# Patient Record
Sex: Female | Born: 1975 | Race: White | Hispanic: No | Marital: Single | Smoking: Current some day smoker
Health system: Southern US, Community
[De-identification: ages and names within clinical notes are randomized; demographics above are authoritative.]

## PROBLEM LIST (undated history)

## (undated) DIAGNOSIS — F32A Depression, unspecified: Secondary | ICD-10-CM

## (undated) DIAGNOSIS — F329 Major depressive disorder, single episode, unspecified: Secondary | ICD-10-CM

## (undated) DIAGNOSIS — T7840XA Allergy, unspecified, initial encounter: Secondary | ICD-10-CM

## (undated) DIAGNOSIS — F419 Anxiety disorder, unspecified: Secondary | ICD-10-CM

## (undated) HISTORY — DX: Anxiety disorder, unspecified: F41.9

## (undated) HISTORY — DX: Depression, unspecified: F32.A

## (undated) HISTORY — DX: Major depressive disorder, single episode, unspecified: F32.9

## (undated) HISTORY — DX: Allergy, unspecified, initial encounter: T78.40XA

## (undated) HISTORY — PX: ABDOMINOPLASTY: SUR9

## (undated) HISTORY — PX: NASAL SEPTUM SURGERY: SHX37

---

## 2003-04-01 ENCOUNTER — Emergency Department (HOSPITAL_COMMUNITY): Admission: EM | Admit: 2003-04-01 | Discharge: 2003-04-01 | Payer: Self-pay | Admitting: Emergency Medicine

## 2003-08-31 ENCOUNTER — Other Ambulatory Visit: Admission: RE | Admit: 2003-08-31 | Discharge: 2003-08-31 | Payer: Self-pay | Admitting: *Deleted

## 2004-02-21 ENCOUNTER — Inpatient Hospital Stay (HOSPITAL_COMMUNITY): Admission: AD | Admit: 2004-02-21 | Discharge: 2004-02-21 | Payer: Self-pay | Admitting: Pediatrics

## 2004-02-22 ENCOUNTER — Inpatient Hospital Stay (HOSPITAL_COMMUNITY): Admission: AD | Admit: 2004-02-22 | Discharge: 2004-02-26 | Payer: Self-pay | Admitting: Obstetrics and Gynecology

## 2004-02-23 ENCOUNTER — Encounter (INDEPENDENT_AMBULATORY_CARE_PROVIDER_SITE_OTHER): Payer: Self-pay | Admitting: Specialist

## 2004-04-10 ENCOUNTER — Other Ambulatory Visit: Admission: RE | Admit: 2004-04-10 | Discharge: 2004-04-10 | Payer: Self-pay | Admitting: Obstetrics and Gynecology

## 2004-09-02 ENCOUNTER — Inpatient Hospital Stay (HOSPITAL_COMMUNITY): Admission: AD | Admit: 2004-09-02 | Discharge: 2004-09-03 | Payer: Self-pay | Admitting: Obstetrics and Gynecology

## 2005-08-07 ENCOUNTER — Ambulatory Visit: Payer: Self-pay | Admitting: Family Medicine

## 2007-08-03 ENCOUNTER — Ambulatory Visit (HOSPITAL_COMMUNITY): Admission: RE | Admit: 2007-08-03 | Discharge: 2007-08-03 | Payer: Self-pay | Admitting: Obstetrics and Gynecology

## 2007-08-06 ENCOUNTER — Ambulatory Visit (HOSPITAL_COMMUNITY): Admission: RE | Admit: 2007-08-06 | Discharge: 2007-08-06 | Payer: Self-pay | Admitting: Internal Medicine

## 2007-08-10 ENCOUNTER — Ambulatory Visit (HOSPITAL_COMMUNITY): Admission: RE | Admit: 2007-08-10 | Discharge: 2007-08-10 | Payer: Self-pay | Admitting: Obstetrics and Gynecology

## 2007-08-13 ENCOUNTER — Ambulatory Visit (HOSPITAL_COMMUNITY): Admission: RE | Admit: 2007-08-13 | Discharge: 2007-08-13 | Payer: Self-pay | Admitting: Obstetrics and Gynecology

## 2007-08-14 ENCOUNTER — Ambulatory Visit (HOSPITAL_COMMUNITY): Admission: RE | Admit: 2007-08-14 | Discharge: 2007-08-14 | Payer: Self-pay | Admitting: Obstetrics and Gynecology

## 2007-08-17 ENCOUNTER — Ambulatory Visit (HOSPITAL_COMMUNITY): Admission: RE | Admit: 2007-08-17 | Discharge: 2007-08-17 | Payer: Self-pay | Admitting: Obstetrics and Gynecology

## 2007-08-20 ENCOUNTER — Ambulatory Visit (HOSPITAL_COMMUNITY): Admission: RE | Admit: 2007-08-20 | Discharge: 2007-08-20 | Payer: Self-pay | Admitting: Obstetrics and Gynecology

## 2007-08-25 ENCOUNTER — Ambulatory Visit (HOSPITAL_COMMUNITY): Admission: RE | Admit: 2007-08-25 | Discharge: 2007-08-25 | Payer: Self-pay | Admitting: Obstetrics and Gynecology

## 2007-08-27 ENCOUNTER — Ambulatory Visit (HOSPITAL_COMMUNITY): Admission: RE | Admit: 2007-08-27 | Discharge: 2007-08-27 | Payer: Self-pay | Admitting: Obstetrics and Gynecology

## 2007-09-02 ENCOUNTER — Ambulatory Visit (HOSPITAL_COMMUNITY): Admission: RE | Admit: 2007-09-02 | Discharge: 2007-09-02 | Payer: Self-pay | Admitting: Obstetrics and Gynecology

## 2007-09-10 ENCOUNTER — Ambulatory Visit (HOSPITAL_COMMUNITY): Admission: RE | Admit: 2007-09-10 | Discharge: 2007-09-10 | Payer: Self-pay | Admitting: Obstetrics and Gynecology

## 2007-09-17 ENCOUNTER — Ambulatory Visit (HOSPITAL_COMMUNITY): Admission: RE | Admit: 2007-09-17 | Discharge: 2007-09-17 | Payer: Self-pay | Admitting: Obstetrics and Gynecology

## 2007-09-23 ENCOUNTER — Ambulatory Visit (HOSPITAL_COMMUNITY): Admission: RE | Admit: 2007-09-23 | Discharge: 2007-09-23 | Payer: Self-pay | Admitting: Obstetrics and Gynecology

## 2007-09-29 ENCOUNTER — Ambulatory Visit: Payer: Self-pay | Admitting: Obstetrics & Gynecology

## 2007-10-02 ENCOUNTER — Ambulatory Visit (HOSPITAL_COMMUNITY): Admission: RE | Admit: 2007-10-02 | Discharge: 2007-10-02 | Payer: Self-pay | Admitting: Obstetrics and Gynecology

## 2007-10-07 ENCOUNTER — Ambulatory Visit (HOSPITAL_COMMUNITY): Admission: RE | Admit: 2007-10-07 | Discharge: 2007-10-07 | Payer: Self-pay | Admitting: Obstetrics and Gynecology

## 2007-10-09 ENCOUNTER — Ambulatory Visit: Payer: Self-pay | Admitting: Obstetrics & Gynecology

## 2007-10-13 ENCOUNTER — Ambulatory Visit: Payer: Self-pay | Admitting: Obstetrics & Gynecology

## 2007-10-16 ENCOUNTER — Ambulatory Visit: Payer: Self-pay | Admitting: Obstetrics & Gynecology

## 2007-10-20 ENCOUNTER — Ambulatory Visit (HOSPITAL_COMMUNITY): Admission: RE | Admit: 2007-10-20 | Discharge: 2007-10-20 | Payer: Self-pay | Admitting: Obstetrics and Gynecology

## 2007-10-23 ENCOUNTER — Ambulatory Visit: Payer: Self-pay | Admitting: Obstetrics & Gynecology

## 2007-10-27 ENCOUNTER — Ambulatory Visit: Payer: Self-pay | Admitting: Obstetrics & Gynecology

## 2007-10-30 ENCOUNTER — Ambulatory Visit: Payer: Self-pay | Admitting: Obstetrics & Gynecology

## 2007-11-03 ENCOUNTER — Ambulatory Visit: Payer: Self-pay | Admitting: Obstetrics & Gynecology

## 2007-11-04 ENCOUNTER — Encounter (INDEPENDENT_AMBULATORY_CARE_PROVIDER_SITE_OTHER): Payer: Self-pay | Admitting: Obstetrics and Gynecology

## 2007-11-04 ENCOUNTER — Inpatient Hospital Stay (HOSPITAL_COMMUNITY): Admission: RE | Admit: 2007-11-04 | Discharge: 2007-11-07 | Payer: Self-pay | Admitting: Obstetrics and Gynecology

## 2008-05-26 IMAGING — US US OB FOLLOW-UP
1 series · 14 of 28 positions shown · non-contrast
Comparison: none

OBSTETRICAL ULTRASOUND:
 This ultrasound was performed in The [HOSPITAL], and the [facility] report will be stored to [REDACTED] PACS.

[Series 1: us ob follow-up · 14 of 43 slices shown]
[im 2/43]
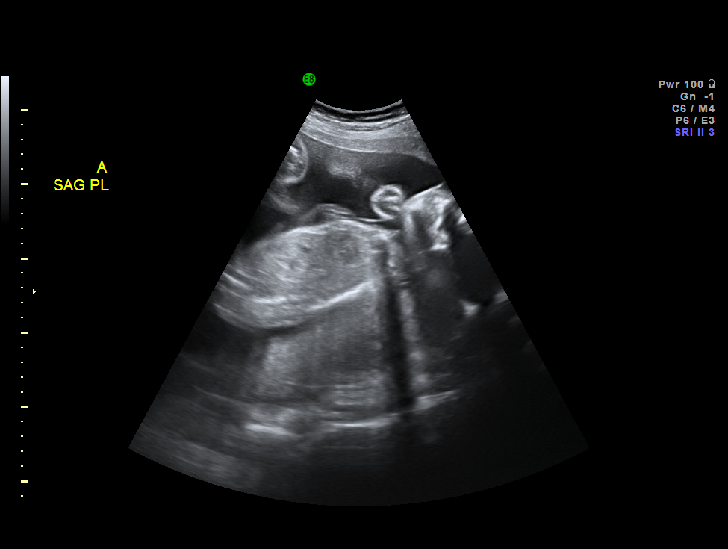
[im 5/43]
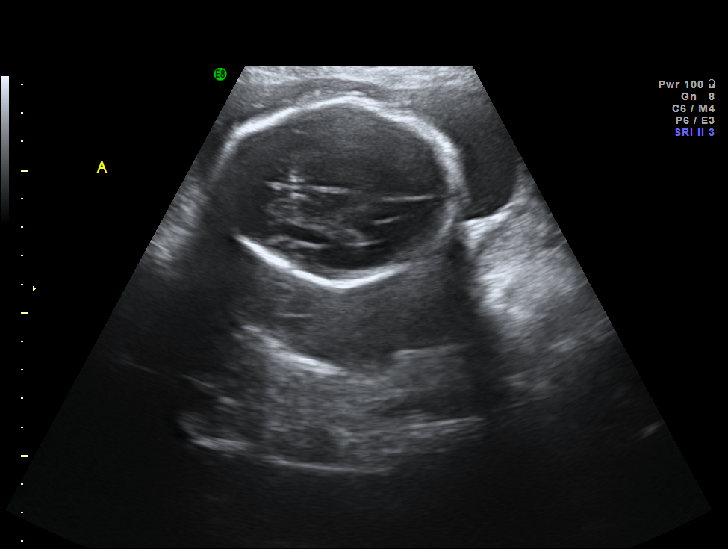
[im 8/43]
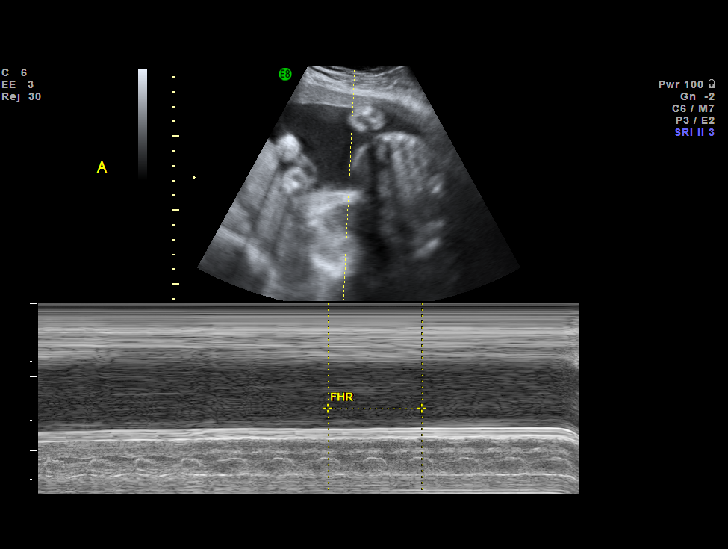
[im 11/43]
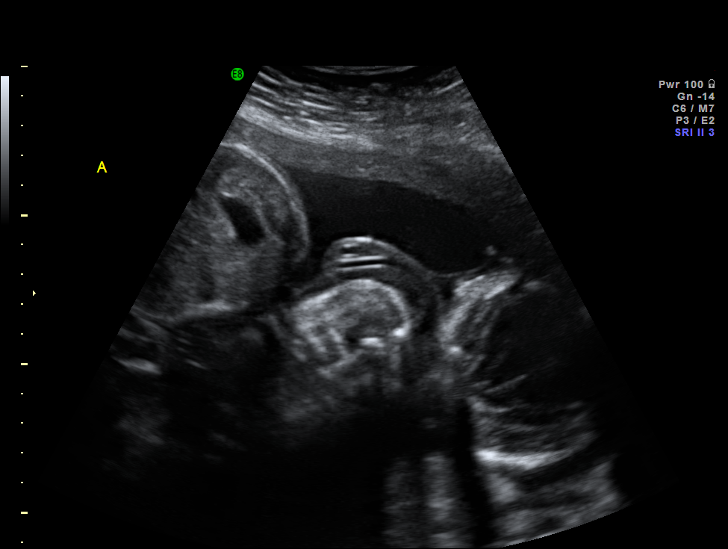
[im 15/43]
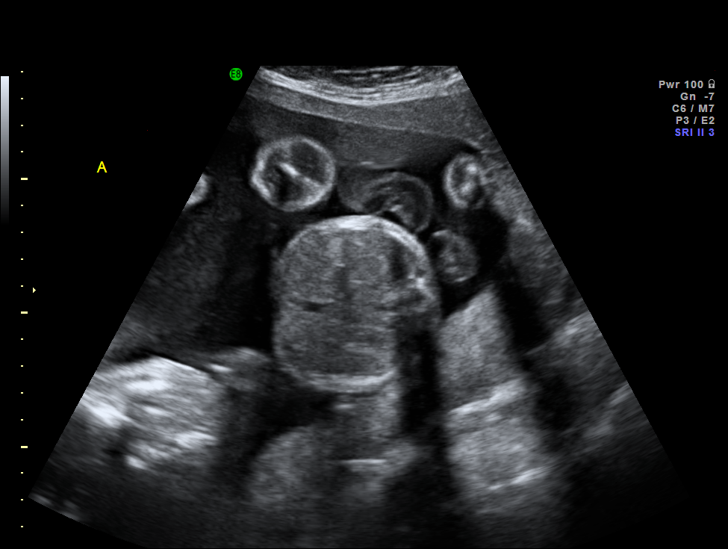
[im 18/43]
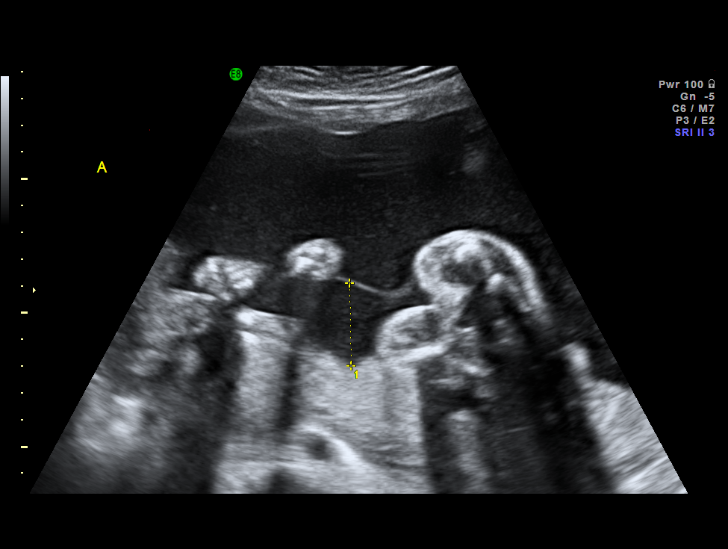
[im 21/43]
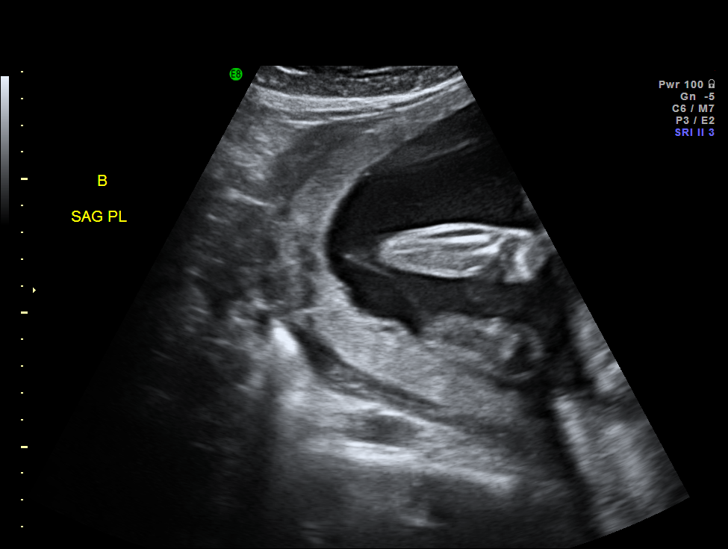
[im 24/43]
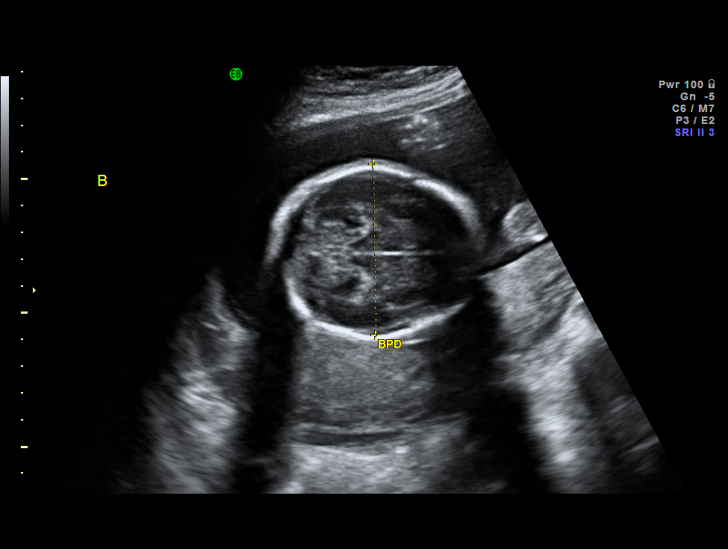
[im 27/43]
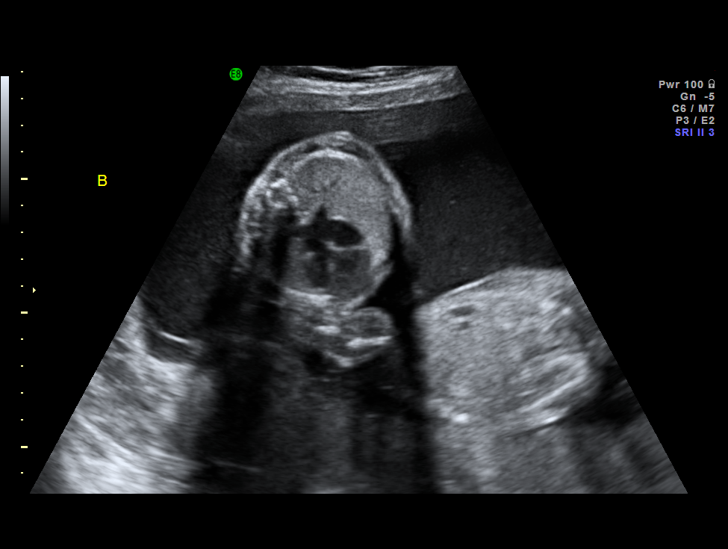
[im 30/43]
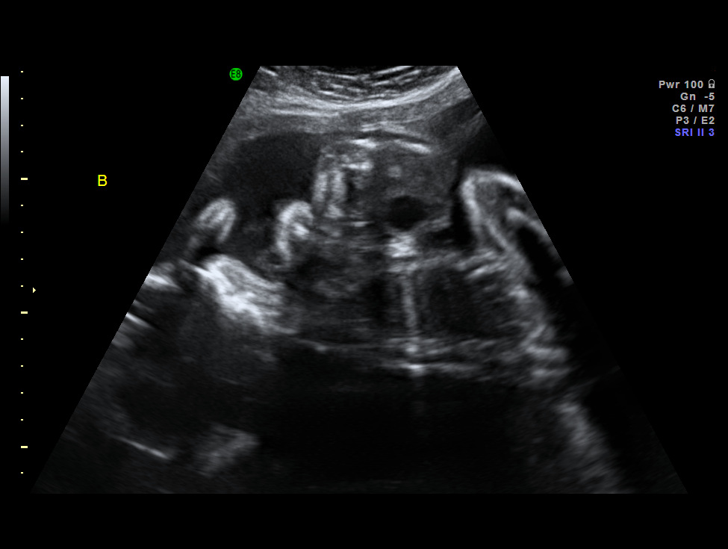
[im 33/43]
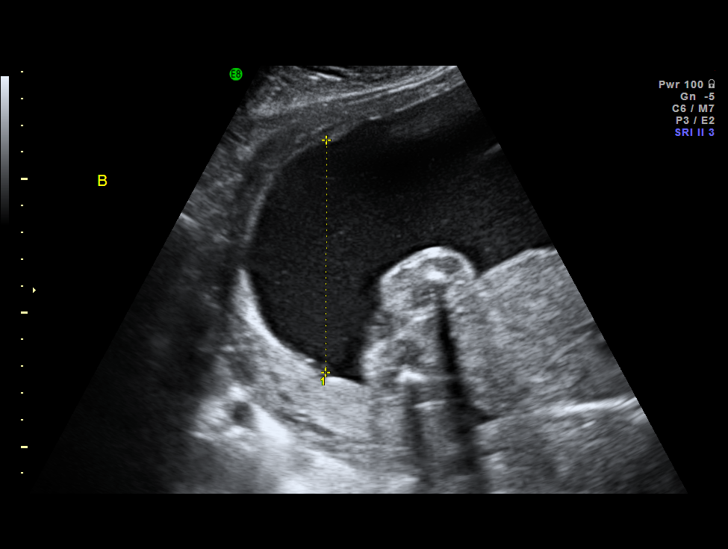
[im 36/43]
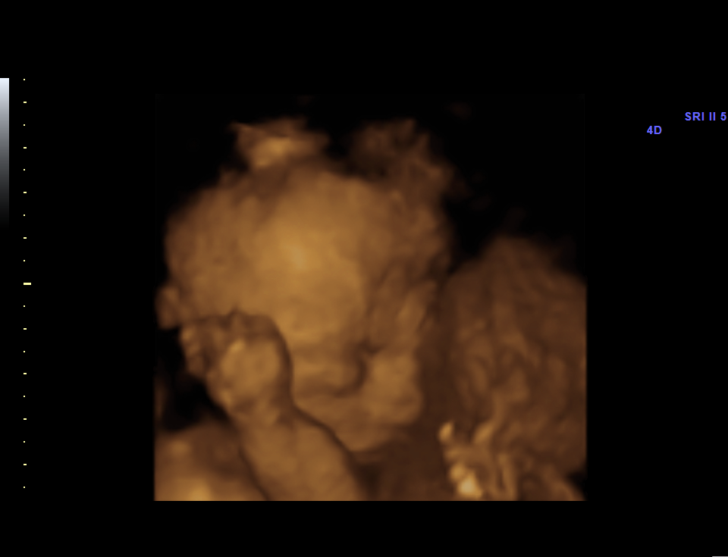
[im 39/43]
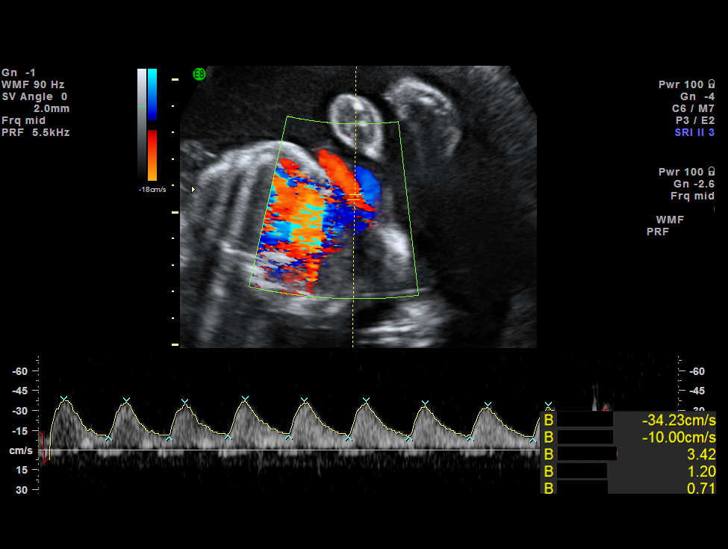
[im 43/43]
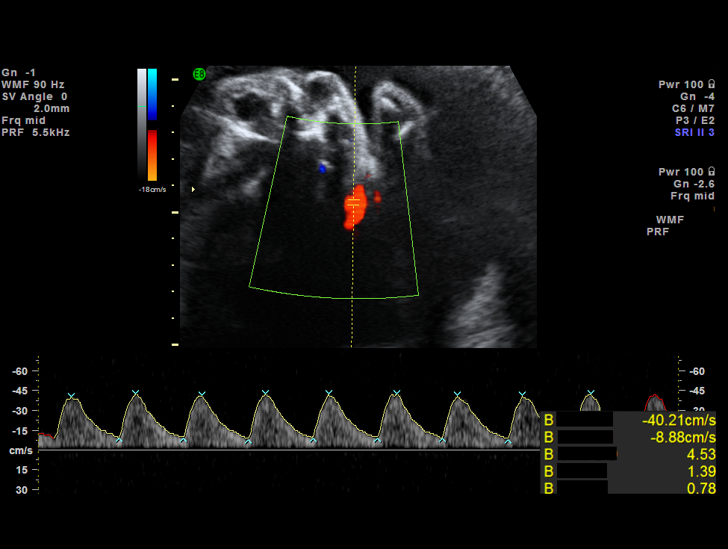

[14 of 28 positions shown; findings below may reference images not displayed]

IMPRESSION: The [facility] report has also been faxed to the ordering physician.

## 2008-06-11 IMAGING — US US OB LIMITED
1 series · 14 of 28 positions shown · non-contrast
Comparison: none

OBSTETRICAL ULTRASOUND:
 This ultrasound was performed in The [HOSPITAL], and the [facility] report will be stored to [REDACTED] PACS.

[Series 1: us ob limited · 14 of 50 slices shown]
[im 2/50]
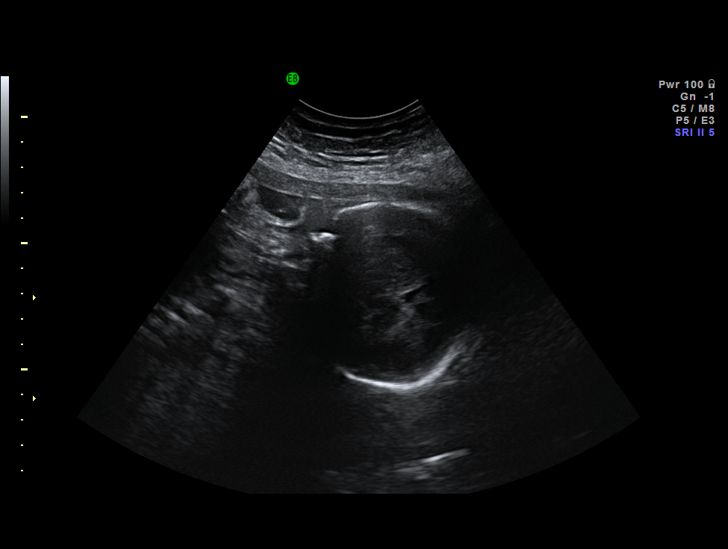
[im 6/50]
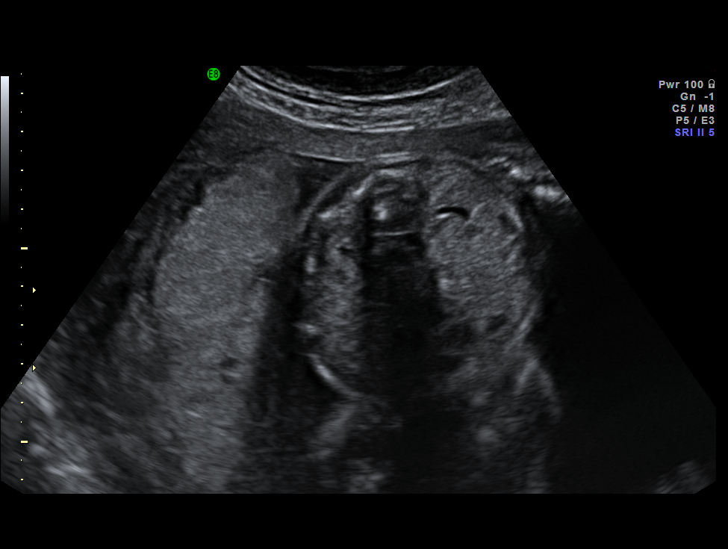
[im 10/50]
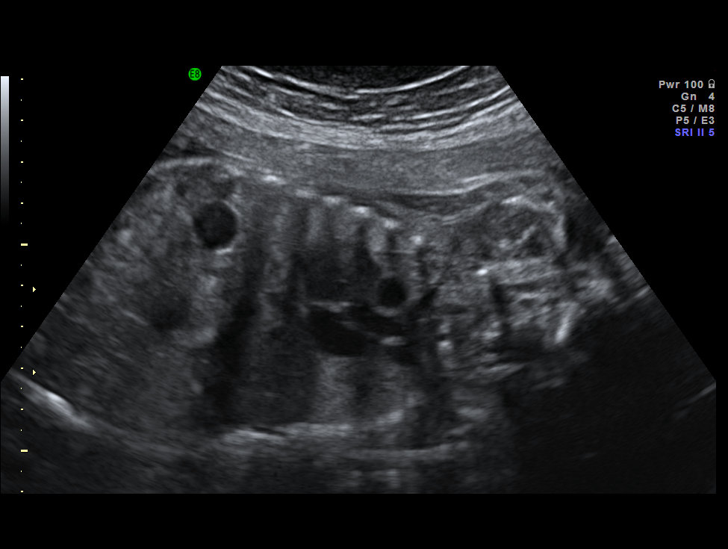
[im 13/50]
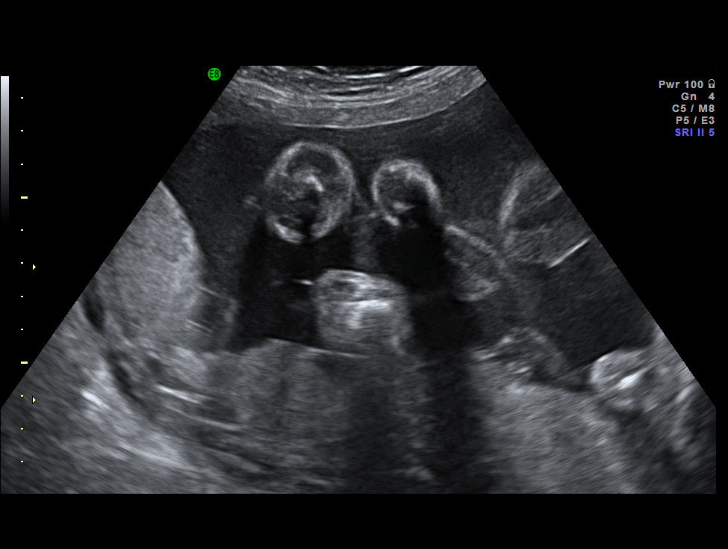
[im 17/50]
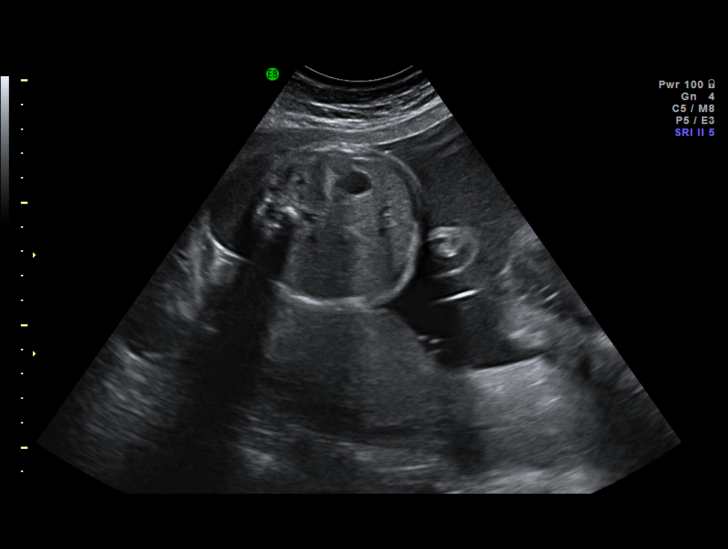
[im 20/50]
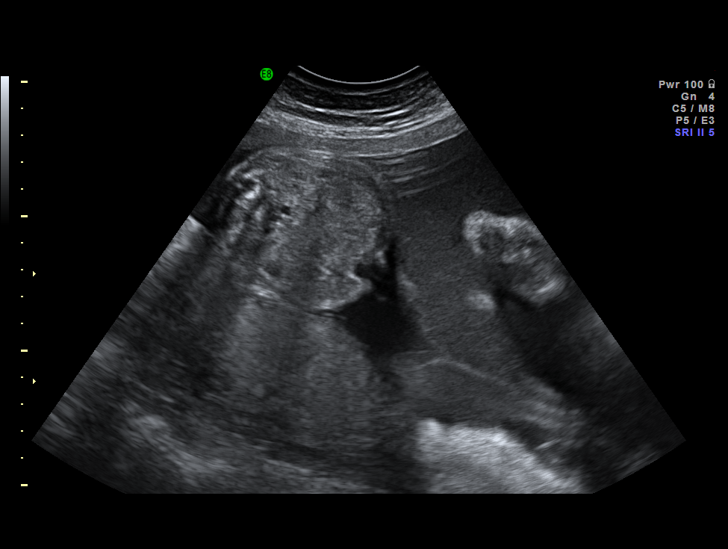
[im 24/50]
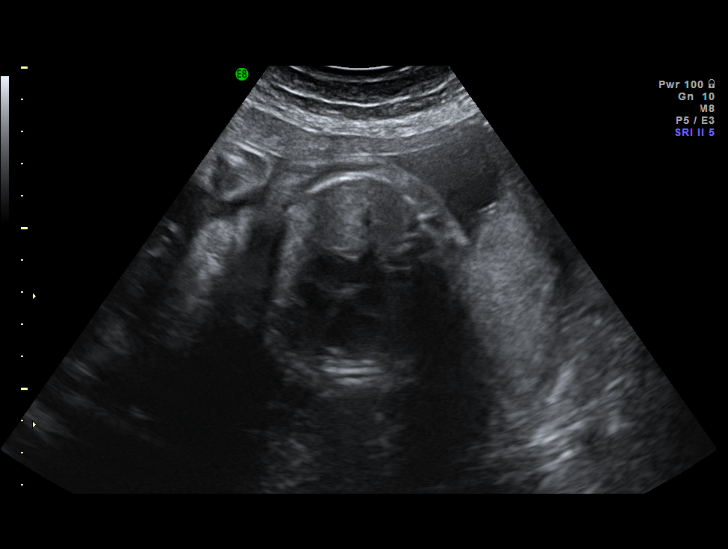
[im 28/50]
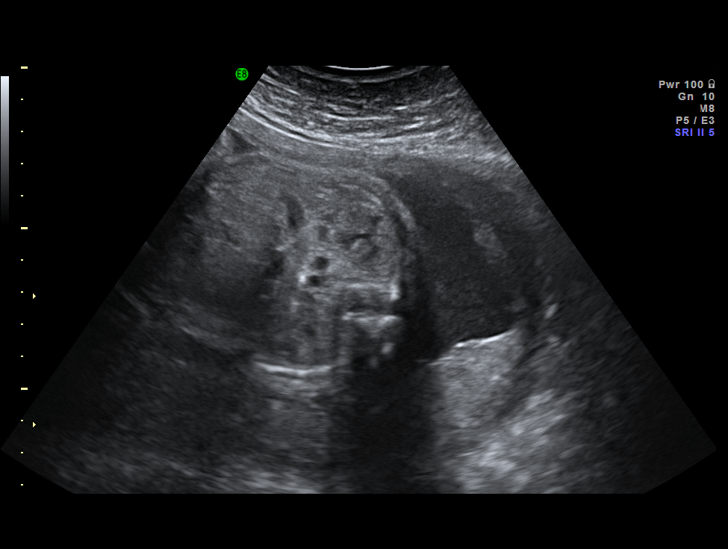
[im 31/50]
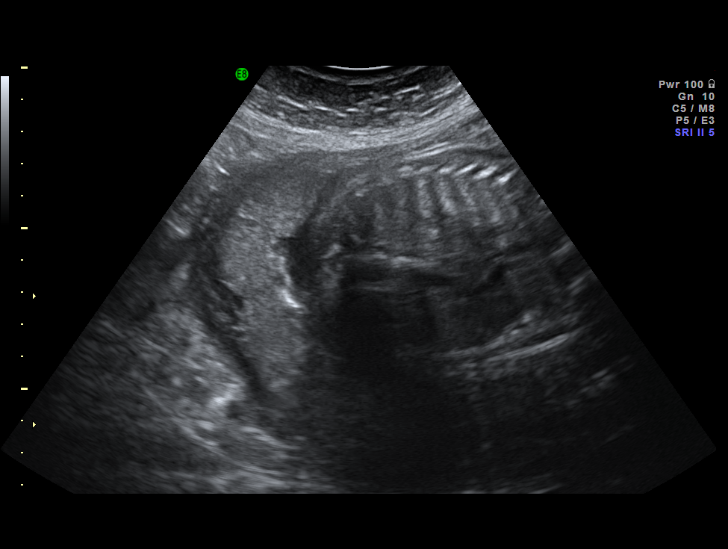
[im 35/50]
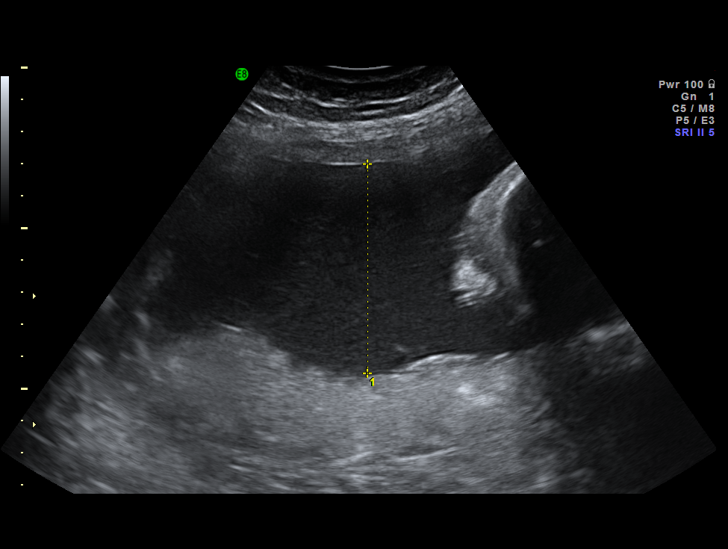
[im 39/50]
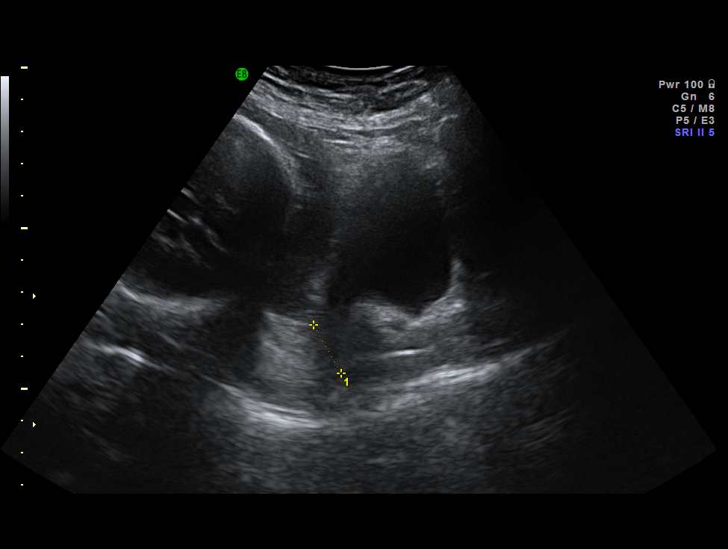
[im 42/50]
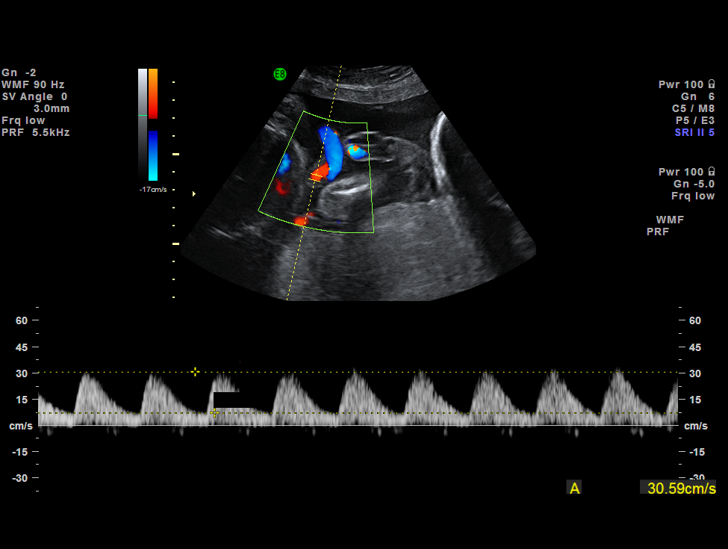
[im 46/50]
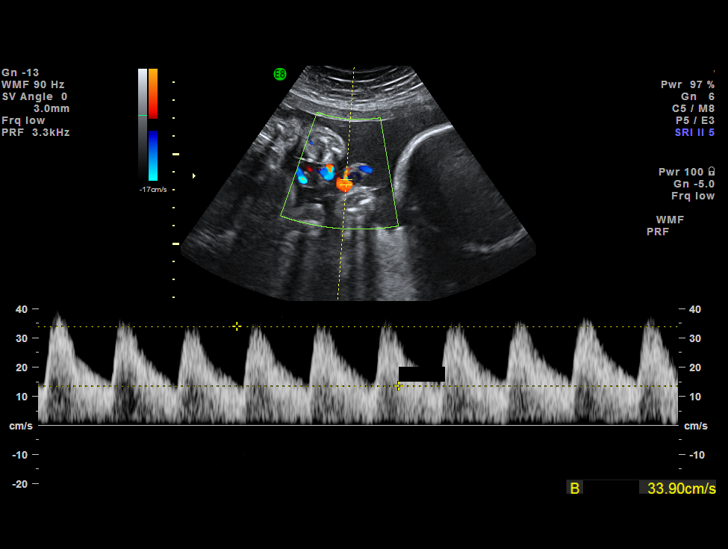
[im 50/50]
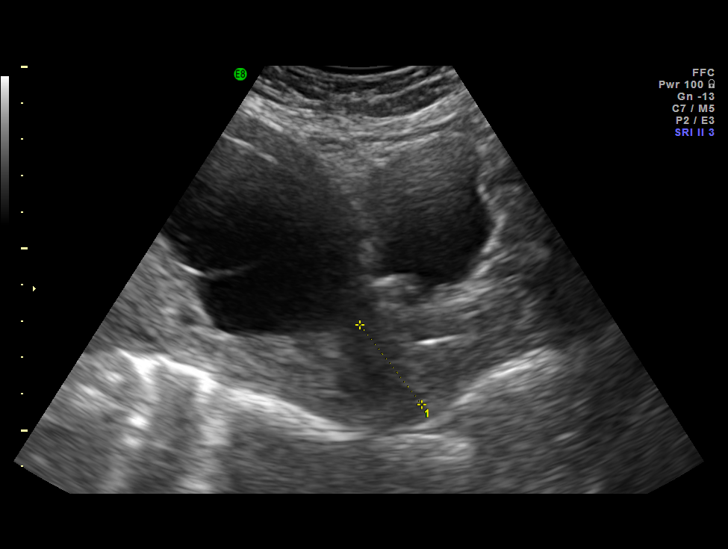

[14 of 28 positions shown; findings below may reference images not displayed]

IMPRESSION: The [facility] report has also been faxed to the ordering physician.

## 2010-12-09 ENCOUNTER — Encounter (HOSPITAL_COMMUNITY): Payer: Self-pay | Admitting: Obstetrics and Gynecology

## 2011-04-02 NOTE — Op Note (Signed)
NAMEMIHIRA, TOZZI NO.:  [id number]   MEDICAL RECORD NO.:  [id number]          PATIENT TYPE:  WOC   LOCATION:  WOC                          FACILITY:  [facility]   PHYSICIAN:  Duke Salvia. Marcelle Overlie, M.D.DATE OF BIRTH:  1976-11-12   DATE OF PROCEDURE:  11/04/2007  DATE OF DISCHARGE:                               OPERATIVE REPORT   PREOPERATIVE DIAGNOSES:  1. A 37-1/2 to 38 week intrauterine pregnancy.  2. Twin gestation.  3. 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  4. Previous cesarean.   POSTOPERATIVE DIAGNOSES:  1. A 37-1/2 to 38 week intrauterine pregnancy.  2. Twin gestation.  3. 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  4. Previous cesarean.   PROCEDURE:  Repeat low transverse cesarean section.   SURGEON:  Dr. Marcelle Overlie.   ANESTHESIA:  Spinal.   COMPLICATIONS:  None.   DRAINS:  Foley catheter.   BLOOD LOSS:  800 mL.   SPECIMENS TO PATHOLOGY:  Placenta.   PROCEDURE AND FINDINGS:  The patient was taken to the operating room  after an adequate level of spinal anesthetic was obtained with the  patient in left tilt position.  The abdomen prepped and draped in the  usual manner for several abdominal procedures. The Foley catheter  positioned draining clear urine.  A transverse incision made through the  old scar which was well-healed, carried down to the fascia which was  incised and extended transversely. The  Rectus muscle divided midline,  peritoneum entered superiorly without incident and extended in a  vertical manner. The vesicouterine serosa was then incised and the  bladder was bluntly and sharply dissected below, bladder blade was  repositioned. A transverse incision made in the lower segment,  extended  with bandage scissors, clear fluid noted.  Baby A delivered from the  vertex presentation, a female, Apgars 9 and 10.  The infant was  suctioned, cord clamped and passed to the pediatric team.  The membranes  were then ruptured, clear fluid, baby  B in vertex presentation another  female, Apgars 9 and 9. A pH was sent on both. Both babies in excellent  condition at that point.  The placenta was delivered manually intact,  sent to pathology.  Uterus exteriorized, cavity wiped clean with  laparotomy pack.  Closure obtained with the first layer of #0 chromic in  locked fashion followed by an imbricating layer of #0 chromic.  This was  hemostatic. The bladder flap area was noted to be intact and hemostatic.  A small hydatid from the right distal tube was clamped, divided and free  tied with a zero tie and sent to pathology.  The tubes and ovaries  otherwise normal. Prior to closure, sponge, needle and instrument counts  were reported as correct x2.  The perineum closed with a running 3-0  Vicryl suture.  The fascia closed from laterally to midline on either  side with a #0 PDS suture.  The subcutaneous tissue was hemostatic.  Clips and Steri-Strips used on the skin.  She tolerated this well and  went to recovery room in good condition.  She did  receive preop  antibiotics and clear urine noted at the end of the case.      Richard M. Marcelle Overlie, M.D.  Electronically Signed     RMH/MEDQ  D:  11/04/2007  T:  11/04/2007  Job:  [id number]

## 2011-04-02 NOTE — H&P (Signed)
NAMEZALIYAH, Nichole Allen NO.:  [id number]   MEDICAL RECORD NO.:  [id number]          PATIENT TYPE:  INP   LOCATION:                                [facility]   PHYSICIAN:  Duke Salvia. Marcelle Overlie, M.D.DATE OF BIRTH:  1976-05-25   DATE OF ADMISSION:  11/03/2007  DATE OF DISCHARGE:                              HISTORY & PHYSICAL   CHIEF COMPLAINT:  For repeat cesarean section at term.   HISTORY OF PRESENT ILLNESS:  A 35 year old G79, P1-0-2-1, EDD January 6,  at 38 weeks, presents for a repeat cesarean section.  This pregnancy has  been complicated by TTTS.  She has been followed weekly by maternal-  fetal medicine with reactive NST A and B and careful serial ultrasound  follow-up.  This procedure including the risks of bleeding, infection,  transfusion, adjacent organ injury, other complications related to  phlebitis, infection, all discussed with her, which she understands and  accepts.   Please see her [facility] form for details of her past medical history.   ALLERGIES:  AMOXICILLIN and SULFA.   She has had two prior pregnancy terminations and a cesarean section in  April 2005 for CPD.   EXAM:  Temperature 98.2, blood pressure 120/80.  HEENT:  Unremarkable.  NECK:  Supple without masses.  LUNGS:  Clear.  CARDIOVASCULAR:  Regular rate and rhythm without murmurs, rubs or  gallops noted.  BREASTS:  Without masses.  Fundal height 42 cm.  Heart rate A and B in the 140s.  Cervix closed.  EXTREMITIES:  Unremarkable.  NEUROLOGIC:  Unremarkable.   IMPRESSION:  1. Thirty-eight week twin gestation.  2. Mild TTTS, followed by maternal-fetal medicine as noted above.  3. Previous cesarean section, desires repeat.   PLAN:  Repeat cesarean section.  The procedure and risks were reviewed  as above.      Richard M. Marcelle Overlie, M.D.  Electronically Signed    RMH/MEDQ  D:  11/03/2007  T:  11/03/2007  Job:  [id number]

## 2011-04-05 NOTE — Op Note (Signed)
NAME:  Nichole Allen, Nichole Allen                         ACCOUNT NO.:  [id number]   MEDICAL RECORD NO.:  [id number]                   PATIENT TYPE:  INP   LOCATION:  9132                                 [facility]   PHYSICIAN:  Duke Salvia. Marcelle Overlie, M.D.            DATE OF BIRTH:  05/18/1976   DATE OF PROCEDURE:  02/23/2004  DATE OF DISCHARGE:                                 OPERATIVE REPORT   PREOPERATIVE DIAGNOSIS:  Cephalopelvic disproportion.   POSTOPERATIVE DIAGNOSIS:  Cephalopelvic disproportion, occiput posterior  presentation.   PROCEDURE:  Primary low transverse cesarean section.   SURGEON:  Duke Salvia. Marcelle Overlie, M.D.   ANESTHESIA:  Epidural.   COMPLICATIONS:  None.   DRAINS:  Foley catheter.   BLOOD LOSS:  800.   PROCEDURE AND FINDINGS:  The patient was taken to the operating room.  After  an adequate level of epidural anesthesia was obtained with the patient in  the left tilt position, the abdomen prepped and draped in the usual manner  for sterile abdominal procedures, Foley catheter positioned draining clear  urine.  Transverse Pfannenstiel incision was made three finger-breadths  above the symphysis, carried down to the fascia which was incised and  extended transversely.  Rectus muscles divided in the midline.  Peritoneum  entered superiorly without incident and extended vertically.  The bladder  blade retractor was positioned.  The vesicouterine serosa was then incised  and with both sharp and blunt dissection this was dissected below and the  bladder blade repositioned.  A transverse incision was made in the lower  segment, extended with blunt dissection.  Clear fluid was noted.  The fetus  was presenting straight OP.  The vertex was easily rotated and delivered  without difficulty.  An 8 pound 11 ounce female, pH 7.36, Apgars 8 and 9.  The  infant was suctioned, cord clamped, and passed to pediatric team for further  care.  The placenta was delivered manually intact  and sent to pathology.  The uterine cavity was wiped clean with a laparotomy pack, closure obtained  of the first layer with 0 chromic in a locked fashion followed by an  imbricating layer of 0 chromic.  This was hemostatic.  The tubes and ovaries  were normal except for a small left peritubular cyst which was removed and  sent to pathology.   Prior to closure, sponge, needle, and instrument counts were reported as  correct x 2.  Peritoneum closed with a running 2-0 Dexon suture.  Fascia  closed from laterally to midline on either side with a 0 PDS suture.  The  subcutaneous tissue was irrigated and noted to be hemostatic.  Clips and  Steri-Strips were used on the skin.  She did  receive Ancef 1 g IV after the cord was clamped.  Immediately prepartum had  a temperature of 101.  Two additional doses of Ancef will be given.  Pitocin  given IV  after the cord was clamped also.  Clear urine noted at the end of  the case.  Mother and baby doing well at that point.                                               Richard M. Marcelle Overlie, M.D.    RMH/MEDQ  D:  02/23/2004  T:  02/24/2004  Job:  [id number]

## 2011-04-05 NOTE — Discharge Summary (Signed)
NAME:  Nichole Allen, Nichole Allen                         ACCOUNT NO.:  [id number]   MEDICAL RECORD NO.:  [id number]                   PATIENT TYPE:  INP   LOCATION:  9132                                 [facility]   PHYSICIAN:  Guy Sandifer. Arleta Creek, M.D.           DATE OF BIRTH:  1976/07/27   DATE OF ADMISSION:  02/22/2004  DATE OF DISCHARGE:  02/25/2004                                 DISCHARGE SUMMARY   ADMISSION DIAGNOSES:  1. Intrauterine pregnancy at 71 weeks estimated gestational age.  2. Labor.   DISCHARGE DIAGNOSES:  1. Intrauterine pregnancy at 56 weeks estimated gestational age.  2. Labor.   PROCEDURE:  On February 23, 2004, low transverse Cesarean section.   REASON FOR ADMISSION:  This patient is a 35 year old white female, G3, P0,  with an EDC of February 29, 2004, who is admitted to the hospital with  spontaneous onset of labor.   HOSPITAL COURSE:  The patient completely dilates and has been pushing for  two to three hours, and a diagnosis of cephalopelvic disproportion is made.  She is taken to the operating room and underwent the above procedure. This  was productive of a viable female infant. On the first postoperative day she  was passing flatus, ambulating, and was afebrile. Abdomen was soft. White  count was 19.5 and hemoglobin 10.3. The following day she continued to feel  well. She remained afebrile. White count 15.4 and hemoglobin 9.6. The  patient wants to go home today.   CONDITION ON DISCHARGE:  Good.   DIET:  Regular as tolerated.   ACTIVITY:  No lifting. No operation of automobiles. No vaginal entry. She is  to call the office for problem including, but not limited to temperature of  101 degrees, persistent nausea and vomiting, heavy vaginal bleeding, or  increasing pain.   MEDICATIONS:  1. Percocet 5/325 mg, #30, one to two p.o. q.6h. p.r.n.  2. Ibuprofen 600 mg p.o. q.6h. p.r.n.  3. Vitamin daily.   FOLLOWUP:  In the office in two weeks.                                   Guy Sandifer Arleta Creek, M.D.    JET/MEDQ  D:  02/25/2004  T:  02/25/2004  Job:  [id number]

## 2011-04-05 NOTE — Discharge Summary (Signed)
Nichole Allen, Nichole Allen               ACCOUNT NO.:  [id number]   MEDICAL RECORD NO.:  [id number]          PATIENT TYPE:  INP   LOCATION:  9121                          [facility]   PHYSICIAN:  Dineen Kid. Rana Snare, M.D.    DATE OF BIRTH:  [birth year]   DATE OF ADMISSION:  11/04/2007  DATE OF DISCHARGE:  11/07/2007                               DISCHARGE SUMMARY   ADMITTING DIAGNOSES:  1. Intrauterine pregnancy at 54 weeks estimated gestational age.  2. Twin gestation.  3. Previous cesarean section.  4. Twin to twin transfusion.   DISCHARGE DIAGNOSIS:  Status post low transverse cesarean section, 2  viable female infants.   PROCEDURE:  Repeat low transverse cesarean section.   REASON FOR ADMISSION:  Please see dictated H&P.   HOSPITAL COURSE:  The patient was a 35 year old, gravida 4, para 1-0-2-1  that presented to [facility] at 38 weeks estimated  gestational age for scheduled cesarean section.  This pregnancy had been  complicated by TTTS.  The patient had been followed weekly by maternal  fetal medicine with reactive nonstress test on A and B with careful  serial ultrasounds.   On the morning of admission, the patient was taken to the operating room  where spinal anesthesia was administered without difficulty.  A low  transverse incision was made with delivery of viable female infants with  Apgars both at 9 at 1 minute and 9 at 5 minutes.  The patient tolerated  the procedure well and was taken to the recovery room in stable  condition.   On postoperative day 1, patient was without complaint.  Vital signs were  stable.  She was afebrile.  Abdomen soft with good return of bowel  function.  Abdominal dressing was noted to be clean, dry, and intact.  Laboratory findings revealed a hemoglobin of 11.6, platelet count of  223,000, WBC count of 16.   On postoperative day 2, patient was without complaint.  Vital signs were  stable.  She was afebrile.  Fundus firm and  nontender.  Abdominal  dressing had been removed revealing an incision that was clean, dry, and  intact.  She is ambulating well.   On postoperative day 3, patient was without complaint.  She was  tolerating a regular diet without complaints of nausea or vomiting.  Vital signs remained stable.  Fundus firm and nontender.  Incision was  clean, dry, and intact.  Staples removed and the patient was later  discharged home.   CONDITION ON DISCHARGE:  Stable.   DIET:  Regular as tolerated.   ACTIVITY:  No heavy lifting.  No driving x2 weeks.  No vaginal entry.   FOLLOWUP:  Patient to follow up in the office in 1 to 2 weeks for an  incision check.  She is to call for a temperature greater than 100  degrees, persistent nausea, vomiting, heavy vaginal bleeding, and/or  redness or drainage from incisional site.   DISCHARGE MEDICATIONS:  Tylenol __________  30 one p.o. every 4 to 6  hours p.r.n., Motrin 600 mg every 6 hours, prenatal vitamins one  p.o.  daily, Colace liquid daily p.r.n.      Julio Sicks, N.P.      Dineen Kid Rana Snare, M.D.  Electronically Signed    CC/MEDQ  D:  11/20/2007  T:  11/20/2007  Job:  [id number]

## 2011-08-23 LAB — CBC
HCT: 34.9 — ABNORMAL LOW
Hemoglobin: 12.3
RBC: 3.42 — ABNORMAL LOW
RDW: 14
WBC: 16 — ABNORMAL HIGH
WBC: 9.7

## 2011-08-23 LAB — TYPE AND SCREEN: Antibody Screen: NEGATIVE

## 2015-03-23 ENCOUNTER — Ambulatory Visit (INDEPENDENT_AMBULATORY_CARE_PROVIDER_SITE_OTHER): Payer: Self-pay | Admitting: Emergency Medicine

## 2015-03-23 VITALS — BP 100/70 | HR 78 | Temp 98.1°F | Ht 63.75 in | Wt 191.1 lb

## 2015-03-23 DIAGNOSIS — J111 Influenza due to unidentified influenza virus with other respiratory manifestations: Secondary | ICD-10-CM

## 2015-03-23 DIAGNOSIS — J1189 Influenza due to unidentified influenza virus with other manifestations: Secondary | ICD-10-CM

## 2015-03-23 MED ORDER — HYDROCOD POLST-CPM POLST ER 10-8 MG/5ML PO SUER: 5.0000 mL | Freq: Two times a day (BID) | ORAL | Status: DC

## 2015-03-23 MED ORDER — AZITHROMYCIN 250 MG PO TABS: ORAL_TABLET | ORAL | Status: DC

## 2015-03-23 MED ORDER — PSEUDOEPHEDRINE-GUAIFENESIN ER 60-600 MG PO TB12: 1.0000 | ORAL_TABLET | Freq: Two times a day (BID) | ORAL | Status: AC

## 2015-03-23 NOTE — Patient Instructions (Signed)

## 2015-03-23 NOTE — Progress Notes (Signed)
[facility] [address], [place] [id number] [phone]  Date:  03/23/2015   Name:  Nichole Allen   DOB:  July 04, 1976   MRN:  [id number]  PCP:  No PCP Per Patient    Chief Complaint: Cough and Fever   History of Present Illness:  Nichole Allen is a 39 y.o. very pleasant female patient who presents with the following:  Ill for a week with fever and cough Has nasal congestion and mucoid drainage Cough productive "dark" sputum No wheezing or shortness of breath Malaise and arthralgias No nausea or vomiting Family ill and tested positive for influenza No improvement with over the counter medications or other home remedies.  Denies other complaint or health concern today.   There are no active problems to display for this patient.   Past Medical History  Diagnosis Date  . Allergy   . Anxiety   . Depression     Past Surgical History  Procedure Laterality Date  . Cesarean section    . Nasal septum surgery      History  Substance Use Topics  . Smoking status: Current Every Day Smoker -- 0.50 packs/day    Types: Cigarettes  . Smokeless tobacco: Not on file  . Alcohol Use: 1.2 oz/week    2 Standard drinks or equivalent per week    Family History  Problem Relation Age of Onset  . Cancer Mother   . Hyperlipidemia Father     Not on File  Medication list has been reviewed and updated.  No current outpatient prescriptions on file prior to visit.   No current facility-administered medications on file prior to visit.    Review of Systems:  Review of Systems  Constitutional: Negative for fever, chills and fatigue.  HENT: Negative for congestion, ear pain, hearing loss, postnasal drip, rhinorrhea and sinus pressure.   Eyes: Negative for discharge and redness.  Respiratory: Negative for cough, shortness of breath and wheezing.   Cardiovascular: Negative for chest pain and leg swelling.  Gastrointestinal: Negative for nausea, vomiting,  abdominal pain, constipation and blood in stool.  Genitourinary: Negative for dysuria, urgency and frequency.  Musculoskeletal: Negative for neck stiffness.  Skin: Negative for rash.  Neurological: Negative for seizures, weakness and headaches.   Physical Examination: Filed Vitals:   03/23/15 1906  BP: 100/70  Pulse: 78  Temp: 98.1 F (36.7 C)   Filed Vitals:   03/23/15 1906  Height: 5' 3.75" (1.619 m)  Weight: 191 lb 2 oz (86.694 kg)   Body mass index is 33.07 kg/(m^2). Ideal Body Weight: Weight in (lb) to have BMI = 25: 144.2  GEN: WDWN, NAD, Non-toxic, A & O x 3 HEENT: Atraumatic, Normocephalic. Neck supple. No masses, No LAD. Ears and Nose: No external deformity. CV: RRR, No M/G/R. No JVD. No thrill. No extra heart sounds. PULM: CTA B, no wheezes, crackles, rhonchi. No retractions. No resp. distress. No accessory muscle use. ABD: S, NT, ND, +BS. No rebound. No HSM. EXTR: No c/c/e NEURO Normal gait.  PSYCH: Normally interactive. Conversant. Not depressed or anxious appearing.  Calm demeanor.    Assessment and Plan: Influenza Too late for tamiflu mucinex d tussionex Bronchitis biaxin  Signed Phillips OdorJeffery Nijah Orlich, MD

## 2015-05-16 ENCOUNTER — Other Ambulatory Visit: Payer: Self-pay | Admitting: Internal Medicine

## 2015-07-26 ENCOUNTER — Telehealth: Payer: Self-pay

## 2015-07-26 NOTE — Telephone Encounter (Signed)
Pt is needing to get her diflucan rx that was written in may 2016 re-written she has lost the paper copy and did not get it originally filled and is needing it now

## 2015-07-26 NOTE — Telephone Encounter (Signed)
Patient should come in for an office visit to be evaluated. She should not be getting an antibiotic associated fungal infection from her antibiotic course and may.

## 2015-07-26 NOTE — Telephone Encounter (Signed)
Can we send in Rx? 

## 2015-07-26 NOTE — Telephone Encounter (Signed)
Spoke with pt, advised message from Mani. Pt understood. 

## 2016-04-24 ENCOUNTER — Other Ambulatory Visit: Payer: Self-pay | Admitting: Obstetrics and Gynecology

## 2016-04-24 DIAGNOSIS — Z1231 Encounter for screening mammogram for malignant neoplasm of breast: Secondary | ICD-10-CM

## 2016-04-29 ENCOUNTER — Ambulatory Visit: Payer: Self-pay

## 2017-04-23 ENCOUNTER — Other Ambulatory Visit: Payer: Self-pay | Admitting: Obstetrics and Gynecology

## 2017-04-23 DIAGNOSIS — Z1231 Encounter for screening mammogram for malignant neoplasm of breast: Secondary | ICD-10-CM

## 2017-04-29 ENCOUNTER — Ambulatory Visit
Admission: RE | Admit: 2017-04-29 | Discharge: 2017-04-29 | Disposition: A | Discharge status: Home / Self Care | Payer: Self-pay | Source: Ambulatory Visit | Attending: Obstetrics and Gynecology | Admitting: Obstetrics and Gynecology

## 2017-04-29 DIAGNOSIS — Z1231 Encounter for screening mammogram for malignant neoplasm of breast: Secondary | ICD-10-CM

## 2018-04-06 ENCOUNTER — Other Ambulatory Visit: Payer: Self-pay | Admitting: Obstetrics and Gynecology

## 2018-04-06 DIAGNOSIS — Z1231 Encounter for screening mammogram for malignant neoplasm of breast: Secondary | ICD-10-CM

## 2018-05-04 ENCOUNTER — Ambulatory Visit: Payer: Self-pay

## 2018-05-25 ENCOUNTER — Ambulatory Visit: Payer: Self-pay

## 2019-06-14 ENCOUNTER — Other Ambulatory Visit: Payer: Self-pay

## 2019-06-14 ENCOUNTER — Encounter: Payer: Self-pay | Admitting: Emergency Medicine

## 2019-06-14 ENCOUNTER — Ambulatory Visit
Admission: EM | Admit: 2019-06-14 | Discharge: 2019-06-14 | Disposition: A | Discharge status: Home / Self Care | Payer: Medicaid Other | Attending: Family Medicine | Admitting: Family Medicine

## 2019-06-14 DIAGNOSIS — B9689 Other specified bacterial agents as the cause of diseases classified elsewhere: Secondary | ICD-10-CM

## 2019-06-14 DIAGNOSIS — N760 Acute vaginitis: Secondary | ICD-10-CM

## 2019-06-14 LAB — POCT URINALYSIS DIP (MANUAL ENTRY)
Bilirubin, UA: NEGATIVE
Blood, UA: NEGATIVE
Glucose, UA: NEGATIVE mg/dL
Ketones, POC UA: NEGATIVE mg/dL
Leukocytes, UA: NEGATIVE
Nitrite, UA: NEGATIVE
Protein Ur, POC: NEGATIVE mg/dL
Spec Grav, UA: 1.025 (ref 1.010–1.025)
Urobilinogen, UA: 0.2 E.U./dL
pH, UA: 7 (ref 5.0–8.0)

## 2019-06-14 LAB — POCT URINE PREGNANCY: Preg Test, Ur: NEGATIVE

## 2019-06-14 MED ORDER — METRONIDAZOLE 500 MG PO TABS: 500.0000 mg | ORAL_TABLET | Freq: Two times a day (BID) | ORAL | 0 refills | Status: DC

## 2019-06-14 MED ORDER — FLUCONAZOLE 150 MG PO TABS: 150.0000 mg | ORAL_TABLET | Freq: Every day | ORAL | 0 refills | Status: DC

## 2019-06-14 NOTE — Discharge Instructions (Addendum)
Consider a daily probiotic for women Take metronidazole 1 pill 2 times a day.  This will treat bacterial vaginosis Take Diflucan to prevent yeast infection.  Take 1 today and then 1 again in 7 days at the end of the metronidazole Call for problems

## 2019-06-14 NOTE — ED Notes (Signed)
Patient able to ambulate independently  

## 2019-06-14 NOTE — ED Triage Notes (Addendum)
Pt presents to [facility] for assessment of an odor after finishing her period a few days ago.  Denies frequency and denies burning with urination.  Pt states hx of BV

## 2019-06-14 NOTE — ED Provider Notes (Signed)
[facility]    CSN: [id number] Arrival date & time: 06/14/19  1118      History   Chief Complaint Chief Complaint  Patient presents with  . Dysuria    HPI ROSALAND SHIFFMAN is a 43 y.o. female.   HPI  Has vaginal odor that will not go away since her last menses. No dysuria or frequency No itching or discharge No abdominal pain. Flank pain.  Fever No STD exposure  Past Medical History:  Diagnosis Date  . Allergy   . Anxiety   . Depression     There are no active problems to display for this patient.   Past Surgical History:  Procedure Laterality Date  . CESAREAN SECTION    . NASAL SEPTUM SURGERY      OB History   No obstetric history on file.      Home Medications    Prior to Admission medications   Medication Sig Start Date End Date Taking? Authorizing Provider  clonazePAM (KLONOPIN) 0.5 MG tablet Take 0.5-2.5 mg by mouth daily.    [provider]  fluconazole (DIFLUCAN) 150 MG tablet Take 1 tablet (150 mg total) by mouth daily. Repeat in 1 week if needed 06/14/19   Raylene Everts, MD  metroNIDAZOLE (FLAGYL) 500 MG tablet Take 1 tablet (500 mg total) by mouth 2 (two) times daily. 06/14/19   Raylene Everts, MD  sertraline (ZOLOFT) 50 MG tablet Take 50 mg by mouth daily.    [provider]    Family History Family History  Problem Relation Age of Onset  . Cancer Mother   . Breast cancer Mother   . Hyperlipidemia Father     Social History Social History   Tobacco Use  . Smoking status: Current Every Day Smoker    Packs/day: 0.50    Types: Cigarettes  . Smokeless tobacco: Never Used  Substance Use Topics  . Alcohol use: Yes    Alcohol/week: 2.0 standard drinks    Types: 2 Standard drinks or equivalent per week  . Drug use: No     Allergies   Sulfa antibiotics   Review of Systems Review of Systems  Constitutional: Negative for chills and fever.  HENT: Negative for ear pain and sore throat.   Eyes:  Negative for pain and visual disturbance.  Respiratory: Negative for cough and shortness of breath.   Cardiovascular: Negative for chest pain and palpitations.  Gastrointestinal: Negative for abdominal pain and vomiting.  Genitourinary: Negative for dysuria and hematuria.  Musculoskeletal: Negative for arthralgias and back pain.  Skin: Negative for color change and rash.  Neurological: Negative for seizures and syncope.  All other systems reviewed and are negative.    Physical Exam Triage Vital Signs ED Triage Vitals  Enc Vitals Group     BP 06/14/19 1141 123/86     Pulse Rate 06/14/19 1141 69     Resp 06/14/19 1141 16     Temp 06/14/19 1141 98.3 F (36.8 C)     Temp Source 06/14/19 1141 Oral     SpO2 06/14/19 1141 97 %     Weight --      Height --      Head Circumference --      Peak Flow --      Pain Score 06/14/19 1146 0     Pain Loc --      Pain Edu? --      Excl. in [place]? --    No data  found.  Updated Vital Signs BP 123/86 (BP Location: Right Arm)   Pulse 69   Temp 98.3 F (36.8 C) (Oral)   Resp 16   SpO2 97%      Physical Exam Constitutional:      General: She is not in acute distress.    Appearance: She is well-developed.  HENT:     Head: Normocephalic and atraumatic.  Eyes:     Conjunctiva/sclera: Conjunctivae normal.     Pupils: Pupils are equal, round, and reactive to light.  Neck:     Musculoskeletal: Normal range of motion.  Cardiovascular:     Rate and Rhythm: Normal rate.  Pulmonary:     Effort: Pulmonary effort is normal. No respiratory distress.  Abdominal:     General: There is no distension.     Palpations: Abdomen is soft.  Musculoskeletal: Normal range of motion.  Skin:    General: Skin is warm and dry.  Neurological:     Mental Status: She is alert.      UC Treatments / Results  Labs (all labs ordered are listed, but only abnormal results are displayed) Labs Reviewed  POCT URINALYSIS DIP (MANUAL ENTRY) - Normal  POCT URINE  PREGNANCY - Normal    EKG   Radiology No results found.  Procedures Procedures (including critical care time)  Medications Ordered in UC Medications - No data to display  Initial Impression / Assessment and Plan / UC Course  I have reviewed the triage vital signs and the nursing notes.  Pertinent labs & imaging results that were available during my care of the patient were reviewed by me and considered in my medical decision making (see chart for details).     Discussed BV Final Clinical Impressions(s) / UC Diagnoses   Final diagnoses:  BV (bacterial vaginosis)     Discharge Instructions     Consider a daily probiotic for women Take metronidazole 1 pill 2 times a day.  This will treat bacterial vaginosis Take Diflucan to prevent yeast infection.  Take 1 today and then 1 again in 7 days at the end of the metronidazole Call for problems   ED Prescriptions    Medication Sig Dispense Auth. Provider   metroNIDAZOLE (FLAGYL) 500 MG tablet Take 1 tablet (500 mg total) by mouth 2 (two) times daily. 14 tablet Eustace MooreNelson,  Sue, MD   fluconazole (DIFLUCAN) 150 MG tablet Take 1 tablet (150 mg total) by mouth daily. Repeat in 1 week if needed 2 tablet Eustace MooreNelson,  Sue, MD     Controlled Substance Prescriptions [place] Controlled Substance Registry consulted? Not Applicable   Eustace MooreNelson,  Sue, MD 06/14/19 1359

## 2019-06-16 ENCOUNTER — Ambulatory Visit: Payer: Self-pay | Admitting: Family Medicine

## 2021-03-21 ENCOUNTER — Other Ambulatory Visit: Payer: Self-pay | Admitting: Obstetrics and Gynecology

## 2021-03-21 DIAGNOSIS — Z1231 Encounter for screening mammogram for malignant neoplasm of breast: Secondary | ICD-10-CM

## 2021-05-15 ENCOUNTER — Inpatient Hospital Stay: Admission: RE | Admit: 2021-05-15 | Payer: Medicaid Other | Source: Ambulatory Visit

## 2021-07-06 ENCOUNTER — Ambulatory Visit: Payer: Medicaid Other

## 2021-09-14 ENCOUNTER — Encounter (HOSPITAL_BASED_OUTPATIENT_CLINIC_OR_DEPARTMENT_OTHER): Payer: Self-pay | Admitting: Emergency Medicine

## 2021-09-14 ENCOUNTER — Emergency Department (HOSPITAL_BASED_OUTPATIENT_CLINIC_OR_DEPARTMENT_OTHER)
Admission: EM | Admit: 2021-09-14 | Discharge: 2021-09-14 | Disposition: A | Discharge status: Home / Self Care | Payer: Medicaid Other | Attending: Emergency Medicine | Admitting: Emergency Medicine

## 2021-09-14 ENCOUNTER — Emergency Department (HOSPITAL_BASED_OUTPATIENT_CLINIC_OR_DEPARTMENT_OTHER): Payer: Medicaid Other

## 2021-09-14 ENCOUNTER — Other Ambulatory Visit: Payer: Self-pay

## 2021-09-14 DIAGNOSIS — M25561 Pain in right knee: Secondary | ICD-10-CM | POA: Insufficient documentation

## 2021-09-14 DIAGNOSIS — W010XXA Fall on same level from slipping, tripping and stumbling without subsequent striking against object, initial encounter: Secondary | ICD-10-CM | POA: Insufficient documentation

## 2021-09-14 DIAGNOSIS — F17210 Nicotine dependence, cigarettes, uncomplicated: Secondary | ICD-10-CM | POA: Diagnosis not present

## 2021-09-14 DIAGNOSIS — S82034A Nondisplaced transverse fracture of right patella, initial encounter for closed fracture: Secondary | ICD-10-CM | POA: Insufficient documentation

## 2021-09-14 DIAGNOSIS — S8991XA Unspecified injury of right lower leg, initial encounter: Secondary | ICD-10-CM | POA: Diagnosis present

## 2021-09-14 MED ORDER — OXYCODONE-ACETAMINOPHEN 5-325 MG PO TABS: 1.0000 | ORAL_TABLET | Freq: Four times a day (QID) | ORAL | 0 refills | Status: AC | PRN

## 2021-09-14 MED ORDER — OXYCODONE-ACETAMINOPHEN 5-325 MG PO TABS
1.0000 | ORAL_TABLET | Freq: Once | ORAL | Status: AC
  Administered 2021-09-14: 1 via ORAL
  Filled 2021-09-14: qty 1

## 2021-09-14 NOTE — Discharge Instructions (Signed)
You came to the emergency department today to be evaluated for your right knee pain after suffering a fall.  Your x-ray showed that you have a transverse patella fracture.  Due to this you were placed in a knee immobilizer.  You are able to weight-bear as tolerated.  You will need to follow-up with orthopedic provider Dr. Everardo Pacific this coming Tuesday.  I have given you prescription for Percocet, please use for severe pain as needed every 6 hours.  Please take Ibuprofen (Advil, motrin) and Tylenol (acetaminophen) to relieve your pain.    You may take up to 600 MG (3 pills) of normal strength ibuprofen every 8 hours as needed.   You make take tylenol, up to 650mg  every 8 hours as needed.   It is safe to take ibuprofen and tylenol at the same time as they work differently.   Do not take more than 3,000 mg tylenol in a 24 hour period (not more than one dose every 8 hours.  Please check all medication labels as many medications such as pain and cold medications may contain tylenol.  Do not drink alcohol while taking these medications.  Do not take other NSAID'S while taking ibuprofen (such as aleve or naproxen).  Please take ibuprofen with food to decrease stomach upset.  Today you received medications that may make you sleepy or impair your ability to make decisions.  For the next 24 hours please do not drive, operate heavy machinery, care for a small child with out another adult present, or perform any activities that may cause harm to you or someone else if you were to fall asleep or be impaired.   You are being prescribed a medication which may make you sleepy. Please follow up of listed precautions for at least 24 hours after taking one dose.  Get help right away if you have: Severe pain that does not go away. Blue or gray skin below the fracture site, or if your toes on the affected side look blue or gray. Numbness or loss of feeling below the fracture site.

## 2021-09-14 NOTE — ED Triage Notes (Signed)
Pt fell over her dog and landed on her R knee. C/o R knee pain.

## 2021-09-14 NOTE — ED Notes (Signed)
Pt discharged to home. Discharge instructions have been discussed with patient and/or family members. Pt verbally acknowledges understanding d/c instructions, and endorses comprehension to checkout at registration before leaving.  °

## 2021-09-14 NOTE — ED Provider Notes (Signed)
[facility] EMERGENCY DEPT Provider Note   CSN: [id number] Arrival date & time: 09/14/21  2841     History Chief Complaint  Patient presents with   Nichole Allen is a 45 y.o. female with a history of anxiety and depression.  Patient presents with a chief complaint of right knee pain.  Patient reports that approximately 0 730 this morning she tripped over her dog at ground-level and landed on her right knee.  Patient has had pain to right knee since then.  Pain is present with movement and weightbearing.  When leg is in straight position patient rates pain 1/10 on the pain scale.  Pain is 10/10 with any attempt at bending or movement.  Patient reports taking Motrin with some improvement in her pain.  Patient denies hitting her head or any loss of consciousness.  Patient is not on any blood thinners.  Patient denies any neck pain, back pain, numbness, weakness, color change, saddle anesthesia, bowel or bladder dysfunction.   Fall Pertinent negatives include no chest pain, no abdominal pain, no headaches and no shortness of breath.      Past Medical History:  Diagnosis Date   Allergy    Anxiety    Depression     There are no problems to display for this patient.   Past Surgical History:  Procedure Laterality Date   CESAREAN SECTION     NASAL SEPTUM SURGERY       OB History   No obstetric history on file.     Family History  Problem Relation Age of Onset   Cancer Mother    Breast cancer Mother    Hyperlipidemia Father     Social History   Tobacco Use   Smoking status: Every Day    Packs/day: 0.50    Types: Cigarettes   Smokeless tobacco: Never  Substance Use Topics   Alcohol use: Yes    Alcohol/week: 2.0 standard drinks    Types: 2 Standard drinks or equivalent per week   Drug use: No    Home Medications Prior to Admission medications   Medication Sig Start Date End Date Taking? Authorizing Provider  clonazePAM (KLONOPIN) 0.5  MG tablet Take 0.5-2.5 mg by mouth daily.    [provider]  fluconazole (DIFLUCAN) 150 MG tablet Take 1 tablet (150 mg total) by mouth daily. Repeat in 1 week if needed 06/14/19   Eustace Moore, MD  metroNIDAZOLE (FLAGYL) 500 MG tablet Take 1 tablet (500 mg total) by mouth 2 (two) times daily. 06/14/19   Eustace Moore, MD  sertraline (ZOLOFT) 50 MG tablet Take 50 mg by mouth daily.    [provider]    Allergies    Sulfa antibiotics  Review of Systems   Review of Systems  Constitutional:  Negative for chills and fever.  Eyes:  Negative for visual disturbance.  Respiratory:  Negative for shortness of breath.   Cardiovascular:  Negative for chest pain.  Gastrointestinal:  Negative for abdominal pain, nausea and vomiting.  Genitourinary:  Negative for difficulty urinating.  Musculoskeletal:  Positive for arthralgias and joint swelling. Negative for back pain and neck pain.  Skin:  Negative for color change and rash.  Neurological:  Negative for dizziness, tremors, seizures, syncope, facial asymmetry, speech difficulty, weakness, light-headedness, numbness and headaches.  Psychiatric/Behavioral:  Negative for confusion.    Physical Exam Updated Vital Signs BP (!) 142/95 (BP Location: Right Arm)   Pulse 67   Temp  98.1 F (36.7 C) (Oral)   Resp 18   LMP 08/28/2021   SpO2 100%   Physical Exam Vitals and nursing note reviewed.  Constitutional:      General: She is not in acute distress.    Appearance: She is not ill-appearing, toxic-appearing or diaphoretic.  HENT:     Head: Normocephalic.  Eyes:     General: No scleral icterus.       Right eye: No discharge.        Left eye: No discharge.  Cardiovascular:     Rate and Rhythm: Normal rate.  Pulmonary:     Effort: Pulmonary effort is normal.  Musculoskeletal:     Cervical back: Normal.     Thoracic back: No swelling, edema, deformity, signs of trauma, lacerations, spasms, tenderness or bony  tenderness.     Lumbar back: No swelling, edema, deformity, signs of trauma, lacerations, spasms, tenderness or bony tenderness.     Right knee: Swelling and bony tenderness present. No deformity, effusion, erythema, ecchymosis, lacerations or crepitus. Decreased range of motion. No tenderness. No medial joint line, lateral joint line, MCL, LCL, ACL, PCL or patellar tendon tenderness. Normal alignment.     Left knee: No swelling, deformity, effusion, erythema, ecchymosis, lacerations, bony tenderness or crepitus. Normal range of motion. No tenderness. Normal alignment.     Right lower leg: Normal.     Left lower leg: Normal.     Right ankle: No swelling, deformity, ecchymosis or lacerations. No tenderness. Normal range of motion.     Left ankle: No swelling, deformity, ecchymosis or lacerations. No tenderness. Normal range of motion.     Right foot: Normal range of motion and normal capillary refill. No swelling, deformity, laceration, tenderness, bony tenderness or crepitus. Normal pulse.     Left foot: Normal range of motion and normal capillary refill. No swelling, deformity, laceration, tenderness, bony tenderness or crepitus. Normal pulse.     Comments: Pelvis stable.  No tenderness or bony tenderness to bilateral upper legs.  Skin:    General: Skin is warm and dry.  Neurological:     General: No focal deficit present.     Mental Status: She is alert.  Psychiatric:        Behavior: Behavior is cooperative.    ED Results / Procedures / Treatments   Labs (all labs ordered are listed, but only abnormal results are displayed) Labs Reviewed - No data to display  EKG None  Radiology DG Knee Complete 4 Views Right  Result Date: 09/14/2021 CLINICAL DATA:  Fall landing on RIGHT knee this morning, RIGHT knee pain, injury EXAM: RIGHT KNEE - COMPLETE 4+ VIEW COMPARISON:  None FINDINGS: Osseous mineralization normal. Joint spaces preserved. Transverse nondisplaced fracture of mid to inferior  patella. Associated joint effusion. No additional fracture, dislocation, or bone destruction. IMPRESSION: Transverse nondisplaced fracture of the mid to distal patella with associated joint effusion. Electronically Signed   By: Ulyses Southward M.D.   On: 09/14/2021 10:53    Procedures Procedures   Medications Ordered in ED Medications  oxyCODONE-acetaminophen (PERCOCET/ROXICET) 5-325 MG per tablet 1 tablet (1 tablet Oral Given 09/14/21 1326)    ED Course  I have reviewed the triage vital signs and the nursing notes.  Pertinent labs & imaging results that were available during my care of the patient were reviewed by me and considered in my medical decision making (see chart for details).    MDM Rules/Calculators/A&P  Alert 45 year old female no acute stress, nontoxic-appearing.  Presents to ED with chief complaint of right knee pain after suffering a fall at ground-level.  Patient denies any her head or any loss of consciousness.  No midline tenderness or deformity to cervical, thoracic, or lumbar spine.  Patient denies any numbness, weakness, saddle anesthesia, bowel or bladder dysfunction, syncope.  Low suspicion for acute intracranial abnormality or spinal injury at this time.  Patient noted to have diffuse swelling to right knee.  Tenderness over patella.  Decreased range of motion secondary to complaints of pain.  Pulse, motor, sensation intact distally.  X-ray imaging shows transverse patella fracture.  Will place patient in knee immobilizer.  Spoke to PA [employer] with orthopedics who advised patient to be placed in knee immobilizer, weightbearing as tolerated, follow-up with Dr. Austin Miles office on Tuesday.  We will give patient a short course of Percocet for pain management.  Discussed results, findings, treatment and follow up. Patient advised of return precautions. Patient verbalized understanding and agreed with plan.   Final Clinical Impression(s) / ED  Diagnoses Final diagnoses:  Closed nondisplaced transverse fracture of right patella, initial encounter    Rx / DC Orders ED Discharge Orders          Ordered    oxyCODONE-acetaminophen (PERCOCET/ROXICET) 5-325 MG tablet  Every 6 hours PRN        09/14/21 1515             Berneice Heinrich 09/14/21 1531    Vanetta Mulders, MD 09/19/21 1556

## 2021-09-17 ENCOUNTER — Encounter (HOSPITAL_BASED_OUTPATIENT_CLINIC_OR_DEPARTMENT_OTHER): Payer: Self-pay | Admitting: Orthopaedic Surgery

## 2021-09-17 ENCOUNTER — Other Ambulatory Visit: Payer: Self-pay

## 2021-09-18 NOTE — Progress Notes (Signed)

## 2021-09-18 NOTE — H&P (Signed)
PREOPERATIVE H&P  Chief Complaint: right patella fracture  HPI: Nichole Allen is a 45 y.o. female who is scheduled for, Procedure(s): OPEN REDUCTION INTERNAL (ORIF) FIXATION PATELLA.   Patient is a healthy 45  year old who had an injury on 09/14/2021 when she tripped over her dog at ground-level. She landed directly on her right knee. She had immediate pain. She went to [facility] ED. X-rays showed patella fracture. Orthopedics was consulted.   Her symptoms are rated as moderate to severe, and have been worsening.  This is significantly impairing activities of daily living.    Please see clinic note for further details on this patient's care.    She has elected for surgical management.   Past Medical History:  Diagnosis Date   Allergy    Anxiety    Depression    Past Surgical History:  Procedure Laterality Date   CESAREAN SECTION     NASAL SEPTUM SURGERY     Social History   Socioeconomic History   Marital status: Single    Spouse name: Not on file   Number of children: Not on file   Years of education: Not on file   Highest education level: Not on file  Occupational History   Not on file  Tobacco Use   Smoking status: Every Day    Packs/day: 0.25    Types: Cigarettes   Smokeless tobacco: Never  Vaping Use   Vaping Use: Never used  Substance and Sexual Activity   Alcohol use: Yes    Alcohol/week: 2.0 standard drinks    Types: 2 Standard drinks or equivalent per week   Drug use: No   Sexual activity: Not on file  Other Topics Concern   Not on file  Social History Narrative   Not on file   Social Determinants of Health   Financial Resource Strain: Not on file  Food Insecurity: Not on file  Transportation Needs: Not on file  Physical Activity: Not on file  Stress: Not on file  Social Connections: Not on file   Family History  Problem Relation Age of Onset   Cancer Mother    Breast cancer Mother    Hyperlipidemia Father    Allergies   Allergen Reactions   Sulfa Antibiotics Rash   Prior to Admission medications   Medication Sig Start Date End Date Taking? Authorizing Provider  clonazePAM (KLONOPIN) 0.5 MG tablet Take 0.5-2.5 mg by mouth daily.   Yes [provider]  sertraline (ZOLOFT) 50 MG tablet Take 50 mg by mouth daily.    [provider]    ROS: All other systems have been reviewed and were otherwise negative with the exception of those mentioned in the HPI and as above.  Physical Exam: General: Alert, no acute distress Cardiovascular: No pedal edema Respiratory: No cyanosis, no use of accessory musculature GI: No organomegaly, abdomen is soft and non-tender Skin: No lesions in the area of chief complaint Neurologic: Sensation intact distally Psychiatric: Patient is competent for consent with normal mood and affect Lymphatic: No axillary or cervical lymphadenopathy  MUSCULOSKELETAL:  Right knee: swelling right knee. Tenderness to palpation throughout. ROM not tested in setting of known fracture. NVI  Imaging: Xrays of the right knee were reviewed from the ED which demonstrate transverse patella fracture.  Assessment: right patella fracture  Plan: Plan for Procedure(s): OPEN REDUCTION INTERNAL (ORIF) FIXATION PATELLA  The risks benefits and alternatives were discussed with the patient including but not limited to the  risks of nonoperative treatment, versus surgical intervention including infection, bleeding, nerve injury,  blood clots, cardiopulmonary complications, morbidity, mortality, among others, and they were willing to proceed.   The patient acknowledged the explanation, agreed to proceed with the plan and consent was signed.   Operative Plan: ORIF right patella fracture Discharge Medications: Standard DVT Prophylaxis: Aspirin Physical Therapy: Outpatient  PT Special Discharge needs: Knee immobilizer. IceMan   Vernetta Honey, PA-C  09/18/2021 3:05 PM

## 2021-09-19 ENCOUNTER — Encounter (HOSPITAL_BASED_OUTPATIENT_CLINIC_OR_DEPARTMENT_OTHER)
Admission: RE | Disposition: A | Discharge status: Home / Self Care | Payer: Self-pay | Source: Home / Self Care | Attending: Orthopaedic Surgery

## 2021-09-19 ENCOUNTER — Ambulatory Visit (HOSPITAL_BASED_OUTPATIENT_CLINIC_OR_DEPARTMENT_OTHER): Payer: Medicaid Other | Admitting: Anesthesiology

## 2021-09-19 ENCOUNTER — Ambulatory Visit (HOSPITAL_BASED_OUTPATIENT_CLINIC_OR_DEPARTMENT_OTHER)
Admission: RE | Admit: 2021-09-19 | Discharge: 2021-09-19 | Disposition: A | Discharge status: Home / Self Care | Payer: Medicaid Other | Attending: Orthopaedic Surgery | Admitting: Orthopaedic Surgery

## 2021-09-19 ENCOUNTER — Other Ambulatory Visit: Payer: Self-pay

## 2021-09-19 ENCOUNTER — Ambulatory Visit (HOSPITAL_COMMUNITY): Payer: Medicaid Other

## 2021-09-19 ENCOUNTER — Encounter (HOSPITAL_BASED_OUTPATIENT_CLINIC_OR_DEPARTMENT_OTHER): Payer: Self-pay | Admitting: Orthopaedic Surgery

## 2021-09-19 DIAGNOSIS — W010XXA Fall on same level from slipping, tripping and stumbling without subsequent striking against object, initial encounter: Secondary | ICD-10-CM | POA: Diagnosis not present

## 2021-09-19 DIAGNOSIS — F17210 Nicotine dependence, cigarettes, uncomplicated: Secondary | ICD-10-CM | POA: Insufficient documentation

## 2021-09-19 DIAGNOSIS — Z79899 Other long term (current) drug therapy: Secondary | ICD-10-CM | POA: Diagnosis not present

## 2021-09-19 DIAGNOSIS — Z882 Allergy status to sulfonamides status: Secondary | ICD-10-CM | POA: Insufficient documentation

## 2021-09-19 DIAGNOSIS — S82031A Displaced transverse fracture of right patella, initial encounter for closed fracture: Secondary | ICD-10-CM | POA: Diagnosis not present

## 2021-09-19 DIAGNOSIS — Z419 Encounter for procedure for purposes other than remedying health state, unspecified: Secondary | ICD-10-CM

## 2021-09-19 HISTORY — PX: ORIF PATELLA: SHX5033

## 2021-09-19 LAB — POCT PREGNANCY, URINE: Preg Test, Ur: NEGATIVE

## 2021-09-19 SURGERY — OPEN REDUCTION INTERNAL FIXATION (ORIF) PATELLA
Anesthesia: General | Site: Knee | Laterality: Right

## 2021-09-19 MED ORDER — CEFAZOLIN SODIUM-DEXTROSE 2-4 GM/100ML-% IV SOLN
2.0000 g | INTRAVENOUS | Status: AC
  Administered 2021-09-19: 2 g via INTRAVENOUS

## 2021-09-19 MED ORDER — OXYCODONE HCL 5 MG PO TABS
5.0000 mg | ORAL_TABLET | Freq: Once | ORAL | Status: AC
  Administered 2021-09-19: 5 mg via ORAL

## 2021-09-19 MED ORDER — ACETAMINOPHEN 500 MG PO TABS
1000.0000 mg | ORAL_TABLET | Freq: Once | ORAL | Status: AC
  Administered 2021-09-19: 1000 mg via ORAL

## 2021-09-19 MED ORDER — ASPIRIN 81 MG PO CHEW: 81.0000 mg | CHEWABLE_TABLET | Freq: Two times a day (BID) | ORAL | 0 refills | Status: AC

## 2021-09-19 MED ORDER — FENTANYL CITRATE (PF) 100 MCG/2ML IJ SOLN
INTRAMUSCULAR | Status: AC
  Filled 2021-09-19: qty 2

## 2021-09-19 MED ORDER — HYDROMORPHONE HCL 1 MG/ML IJ SOLN
INTRAMUSCULAR | Status: AC
  Filled 2021-09-19: qty 0.5

## 2021-09-19 MED ORDER — MIDAZOLAM HCL 2 MG/2ML IJ SOLN
INTRAMUSCULAR | Status: AC
  Filled 2021-09-19: qty 2

## 2021-09-19 MED ORDER — ACETAMINOPHEN 500 MG PO TABS
ORAL_TABLET | ORAL | Status: AC
  Filled 2021-09-19: qty 2

## 2021-09-19 MED ORDER — LIDOCAINE HCL (CARDIAC) PF 100 MG/5ML IV SOSY
PREFILLED_SYRINGE | INTRAVENOUS | Status: DC | PRN
  Administered 2021-09-19: 80 mg via INTRAVENOUS

## 2021-09-19 MED ORDER — VANCOMYCIN HCL 1000 MG IV SOLR
INTRAVENOUS | Status: AC
  Filled 2021-09-19: qty 20

## 2021-09-19 MED ORDER — HYDROMORPHONE HCL 1 MG/ML IJ SOLN
INTRAMUSCULAR | Status: DC | PRN
  Administered 2021-09-19 (×2): .25 mg via INTRAVENOUS

## 2021-09-19 MED ORDER — CEFAZOLIN SODIUM-DEXTROSE 2-4 GM/100ML-% IV SOLN
INTRAVENOUS | Status: AC
  Filled 2021-09-19: qty 100

## 2021-09-19 MED ORDER — DEXAMETHASONE SODIUM PHOSPHATE 10 MG/ML IJ SOLN
INTRAMUSCULAR | Status: DC | PRN
  Administered 2021-09-19: 10 mg via INTRAVENOUS

## 2021-09-19 MED ORDER — OXYCODONE HCL 5 MG PO TABS
ORAL_TABLET | ORAL | Status: AC
  Filled 2021-09-19: qty 1

## 2021-09-19 MED ORDER — KETOROLAC TROMETHAMINE 30 MG/ML IJ SOLN
INTRAMUSCULAR | Status: DC | PRN
  Administered 2021-09-19: 30 mg via INTRAVENOUS

## 2021-09-19 MED ORDER — OXYCODONE HCL 5 MG PO TABS: ORAL_TABLET | ORAL | 0 refills | Status: AC

## 2021-09-19 MED ORDER — PROMETHAZINE HCL 25 MG/ML IJ SOLN: 6.2500 mg | INTRAMUSCULAR | Status: DC | PRN

## 2021-09-19 MED ORDER — HYDROMORPHONE HCL 1 MG/ML IJ SOLN
0.2500 mg | INTRAMUSCULAR | Status: DC | PRN
  Administered 2021-09-19 (×3): 0.5 mg via INTRAVENOUS

## 2021-09-19 MED ORDER — MIDAZOLAM HCL 5 MG/5ML IJ SOLN
INTRAMUSCULAR | Status: DC | PRN
  Administered 2021-09-19: 2 mg via INTRAVENOUS

## 2021-09-19 MED ORDER — MELOXICAM 15 MG PO TABS: 15.0000 mg | ORAL_TABLET | Freq: Every day | ORAL | 0 refills | Status: DC

## 2021-09-19 MED ORDER — VANCOMYCIN HCL 1 G IV SOLR
INTRAVENOUS | Status: DC | PRN
  Administered 2021-09-19: 1000 mg via TOPICAL

## 2021-09-19 MED ORDER — FENTANYL CITRATE (PF) 100 MCG/2ML IJ SOLN
INTRAMUSCULAR | Status: DC | PRN
  Administered 2021-09-19 (×2): 50 ug via INTRAVENOUS

## 2021-09-19 MED ORDER — BUPIVACAINE HCL (PF) 0.25 % IJ SOLN
INTRAMUSCULAR | Status: AC
  Filled 2021-09-19: qty 30

## 2021-09-19 MED ORDER — BUPIVACAINE HCL (PF) 0.5 % IJ SOLN
INTRAMUSCULAR | Status: DC | PRN
  Administered 2021-09-19: 20 mL

## 2021-09-19 MED ORDER — DEXAMETHASONE SODIUM PHOSPHATE 10 MG/ML IJ SOLN
INTRAMUSCULAR | Status: AC
  Filled 2021-09-19: qty 1

## 2021-09-19 MED ORDER — PROPOFOL 10 MG/ML IV BOLUS
INTRAVENOUS | Status: AC
  Filled 2021-09-19: qty 20

## 2021-09-19 MED ORDER — DEXMEDETOMIDINE HCL IN NACL 200 MCG/50ML IV SOLN
INTRAVENOUS | Status: DC | PRN
  Administered 2021-09-19 (×2): 4 ug via INTRAVENOUS
  Administered 2021-09-19: 8 ug via INTRAVENOUS

## 2021-09-19 MED ORDER — LIDOCAINE 2% (20 MG/ML) 5 ML SYRINGE
INTRAMUSCULAR | Status: AC
  Filled 2021-09-19: qty 5

## 2021-09-19 MED ORDER — ACETAMINOPHEN 500 MG PO TABS: 1000.0000 mg | ORAL_TABLET | Freq: Three times a day (TID) | ORAL | 0 refills | Status: AC

## 2021-09-19 MED ORDER — LACTATED RINGERS IV SOLN: INTRAVENOUS | Status: DC

## 2021-09-19 MED ORDER — VANCOMYCIN HCL 500 MG IV SOLR
INTRAVENOUS | Status: AC
  Filled 2021-09-19: qty 10

## 2021-09-19 MED ORDER — ONDANSETRON HCL 4 MG/2ML IJ SOLN
INTRAMUSCULAR | Status: AC
  Filled 2021-09-19: qty 2

## 2021-09-19 MED ORDER — PROPOFOL 10 MG/ML IV BOLUS
INTRAVENOUS | Status: DC | PRN
  Administered 2021-09-19: 200 mg via INTRAVENOUS

## 2021-09-19 MED ORDER — ONDANSETRON HCL 4 MG PO TABS: 4.0000 mg | ORAL_TABLET | Freq: Three times a day (TID) | ORAL | 0 refills | Status: AC | PRN

## 2021-09-19 MED ORDER — ONDANSETRON HCL 4 MG/2ML IJ SOLN
INTRAMUSCULAR | Status: DC | PRN
  Administered 2021-09-19: 4 mg via INTRAVENOUS

## 2021-09-19 SURGICAL SUPPLY — 85 items
APL PRP STRL LF DISP 70% ISPRP (MISCELLANEOUS) ×1
BANDAGE ESMARK 6X9 LF (GAUZE/BANDAGES/DRESSINGS) IMPLANT
BIT DRILL 2.6 CANN (BIT) ×3 IMPLANT
BLADE HEX COATED 2.75 (ELECTRODE) IMPLANT
BLADE SURG 10 STRL SS (BLADE) ×3 IMPLANT
BLADE SURG 15 STRL LF DISP TIS (BLADE) ×2 IMPLANT
BLADE SURG 15 STRL SS (BLADE) ×6
BNDG CMPR 9X6 STRL LF SNTH (GAUZE/BANDAGES/DRESSINGS)
BNDG ELASTIC 4X5.8 VLCR STR LF (GAUZE/BANDAGES/DRESSINGS) ×3 IMPLANT
BNDG ELASTIC 6X5.8 VLCR STR LF (GAUZE/BANDAGES/DRESSINGS) ×3 IMPLANT
BNDG ESMARK 6X9 LF (GAUZE/BANDAGES/DRESSINGS)
CANISTER SUCT 1200ML W/VALVE (MISCELLANEOUS) IMPLANT
CHLORAPREP W/TINT 26 (MISCELLANEOUS) ×3 IMPLANT
CLOSURE STERI-STRIP 1/2X4 (GAUZE/BANDAGES/DRESSINGS) ×1
CLSR STERI-STRIP ANTIMIC 1/2X4 (GAUZE/BANDAGES/DRESSINGS) ×2 IMPLANT
COOLER ICEMAN CLASSIC (MISCELLANEOUS) ×3 IMPLANT
CUFF TOURN SGL QUICK 34 (TOURNIQUET CUFF)
CUFF TRNQT CYL 34X4.125X (TOURNIQUET CUFF) IMPLANT
DECANTER SPIKE VIAL GLASS SM (MISCELLANEOUS) IMPLANT
DRAPE C-ARM 42X72 X-RAY (DRAPES) ×3 IMPLANT
DRAPE C-ARMOR (DRAPES) ×3 IMPLANT
DRAPE EXTREMITY T 121X128X90 (DISPOSABLE) ×3 IMPLANT
DRAPE IMP U-DRAPE 54X76 (DRAPES) ×3 IMPLANT
DRAPE INCISE IOBAN 66X45 STRL (DRAPES) IMPLANT
DRAPE OEC MINIVIEW 54X84 (DRAPES) IMPLANT
DRAPE U-SHAPE 47X51 STRL (DRAPES) ×3 IMPLANT
DRSG MEPILEX BORDER 4X8 (GAUZE/BANDAGES/DRESSINGS) ×3 IMPLANT
DRSG PAD ABDOMINAL 8X10 ST (GAUZE/BANDAGES/DRESSINGS) ×3 IMPLANT
ELECT REM PT RETURN 9FT ADLT (ELECTROSURGICAL) ×3
ELECTRODE REM PT RTRN 9FT ADLT (ELECTROSURGICAL) ×1 IMPLANT
FIBER TAPE 2MM (SUTURE) IMPLANT
FIBERTAPE 2 W/STRL NDL 17 (SUTURE) ×3 IMPLANT
GAUZE SPONGE 4X4 12PLY STRL (GAUZE/BANDAGES/DRESSINGS) ×3 IMPLANT
GAUZE XEROFORM 1X8 LF (GAUZE/BANDAGES/DRESSINGS) IMPLANT
GLOVE SRG 8 PF TXTR STRL LF DI (GLOVE) ×1 IMPLANT
GLOVE SURG ENC MOIS LTX SZ6.5 (GLOVE) ×3 IMPLANT
GLOVE SURG LTX SZ8 (GLOVE) ×3 IMPLANT
GLOVE SURG UNDER POLY LF SZ6.5 (GLOVE) ×3 IMPLANT
GLOVE SURG UNDER POLY LF SZ8 (GLOVE) ×3
GOWN STRL REUS W/ TWL LRG LVL3 (GOWN DISPOSABLE) ×1 IMPLANT
GOWN STRL REUS W/TWL LRG LVL3 (GOWN DISPOSABLE) ×3
GOWN STRL REUS W/TWL XL LVL3 (GOWN DISPOSABLE) ×3 IMPLANT
GUIDEWIRE 1.35MM  DUAL TROCAR (WIRE) ×6
GUIDEWIRE 1.35MM DUAL TROCAR (WIRE) ×2 IMPLANT
IMMOBILIZER KNEE 22 UNIV (SOFTGOODS) IMPLANT
IMMOBILIZER KNEE 24 THIGH 36 (MISCELLANEOUS) IMPLANT
IMMOBILIZER KNEE 24 UNIV (MISCELLANEOUS)
MANIFOLD NEPTUNE II (INSTRUMENTS) IMPLANT
NDL SUT 6 .5 CRC .975X.05 MAYO (NEEDLE) IMPLANT
NEEDLE KEITH (NEEDLE) IMPLANT
NEEDLE MAYO 6 CRC TAPER PT (NEEDLE) IMPLANT
NEEDLE MAYO TAPER (NEEDLE)
NS IRRIG 1000ML POUR BTL (IV SOLUTION) ×3 IMPLANT
PACK ARTHROSCOPY DSU (CUSTOM PROCEDURE TRAY) ×3 IMPLANT
PACK BASIN DAY SURGERY FS (CUSTOM PROCEDURE TRAY) ×3 IMPLANT
PAD CAST 4YDX4 CTTN HI CHSV (CAST SUPPLIES) IMPLANT
PAD COLD SHLDR WRAP-ON (PAD) ×6 IMPLANT
PADDING CAST COTTON 4X4 STRL (CAST SUPPLIES)
PADDING CAST COTTON 6X4 STRL (CAST SUPPLIES) IMPLANT
PENCIL SMOKE EVACUATOR (MISCELLANEOUS) ×3 IMPLANT
SCREW 4.0X30MM (Screw) ×3 IMPLANT
SCREW BLUNT TIP 4MMX32 (Screw) ×3 IMPLANT
SCREW BN 30X4XCANN BLNT TIP (Screw) ×1 IMPLANT
SLEEVE SCD COMPRESS KNEE MED (STOCKING) IMPLANT
SPLINT FAST PLASTER 5X30 (CAST SUPPLIES)
SPLINT PLASTER CAST FAST 5X30 (CAST SUPPLIES) IMPLANT
SPONGE T-LAP 18X18 ~~LOC~~+RFID (SPONGE) ×3 IMPLANT
SUCTION FRAZIER HANDLE 10FR (MISCELLANEOUS) ×3
SUCTION TUBE FRAZIER 10FR DISP (MISCELLANEOUS) ×1 IMPLANT
SUT ETHILON 3 0 PS 1 (SUTURE) IMPLANT
SUT FIBERWIRE #2 38 T-5 BLUE (SUTURE)
SUT MNCRL AB 4-0 PS2 18 (SUTURE) ×3 IMPLANT
SUT VIC AB 0 CT1 27 (SUTURE)
SUT VIC AB 0 CT1 27XBRD ANBCTR (SUTURE) IMPLANT
SUT VIC AB 1 CT1 27 (SUTURE)
SUT VIC AB 1 CT1 27XBRD ANBCTR (SUTURE) IMPLANT
SUT VIC AB 3-0 SH 27 (SUTURE) ×3
SUT VIC AB 3-0 SH 27X BRD (SUTURE) ×1 IMPLANT
SUTURE FIBERWR #2 38 T-5 BLUE (SUTURE) IMPLANT
SYR BULB EAR ULCER 3OZ GRN STR (SYRINGE) IMPLANT
TOWEL GREEN STERILE FF (TOWEL DISPOSABLE) ×9 IMPLANT
WASHER (Orthopedic Implant) ×6 IMPLANT
WASHER ORTHO 7X (Orthopedic Implant) ×2 IMPLANT
WRAP KNEE MAXI GEL POST OP (GAUZE/BANDAGES/DRESSINGS) ×3 IMPLANT
YANKAUER SUCT BULB TIP NO VENT (SUCTIONS) ×3 IMPLANT

## 2021-09-19 NOTE — Anesthesia Procedure Notes (Signed)
Procedure Name: LMA Insertion Date/Time: 09/19/2021 1:06 PM Performed by: Burna Cash, CRNA Pre-anesthesia Checklist: Patient identified, Emergency Drugs available, Suction available and Patient being monitored Patient Re-evaluated:Patient Re-evaluated prior to induction Oxygen Delivery Method: Circle system utilized Preoxygenation: Pre-oxygenation with 100% oxygen Induction Type: IV induction Ventilation: Mask ventilation without difficulty LMA: LMA inserted LMA Size: 4.0 Number of attempts: 1 Airway Equipment and Method: Bite block Placement Confirmation: positive ETCO2 Tube secured with: Tape Dental Injury: Teeth and Oropharynx as per pre-operative assessment

## 2021-09-19 NOTE — Anesthesia Postprocedure Evaluation (Signed)
Anesthesia Post Note  Patient: Nichole Allen  Procedure(s) Performed: OPEN REDUCTION INTERNAL (ORIF) FIXATION PATELLA (Right: Knee)     Patient location during evaluation: PACU Anesthesia Type: General Level of consciousness: awake and alert Pain management: pain level controlled Vital Signs Assessment: post-procedure vital signs reviewed and stable Respiratory status: spontaneous breathing, nonlabored ventilation and respiratory function stable Cardiovascular status: blood pressure returned to baseline and stable Postop Assessment: no apparent nausea or vomiting Anesthetic complications: no   No notable events documented.  Last Vitals:  Vitals:   09/19/21 1515 09/19/21 1518  BP: 111/70   Pulse: 60 66  Resp: 14 (!) 27  Temp:    SpO2: 97% 98%    Last Pain:  Vitals:   09/19/21 1518  TempSrc:   PainSc: 4                  Cecile Hearing

## 2021-09-19 NOTE — Discharge Instructions (Addendum)
Ramond Marrow MD, MPH Alfonse Alpers, PA-C [facility] [address] [phone] (tel)   [phone] (fax)   POST-OPERATIVE INSTRUCTIONS - KNEE  WOUND CARE You may remove the Operative Dressing on Post-Op Day #3 (72hrs after surgery).   Leave steri strips in place.   If you feel more comfortable with it you can leave all dressings in place till your 1 week follow-up appointment.   KEEP THE INCISIONS CLEAN AND DRY. An ACE wrap may be used to control swelling, do not wrap this too tight.  If the initial ACE wrap feels too tight or constricting you may loosen it. There may be a small amount of fluid/bleeding leaking at the surgical site.  This is normal; the knee is filled with fluid during the procedure and can leak for 24-48hrs after surgery.  You may change/reinforce the bandage as needed.  Use the Cryocuff, GameReady or Ice as often as possible for the first 3-4 days, then as needed for pain relief. Always keep a towel, ACE wrap or other barrier between the cooling unit and your skin.  You may shower on Post-Op Day #3. Gently pat the area dry. Do not soak the knee in water.  Do not go swimming in the pool or ocean until 4 weeks after surgery or when otherwise instructed.  BRACE/AMBULATION Your leg will be placed in a brace post-operatively.  You will need to wear your brace at all times until we discuss it further.  It should be locked in full extension (0 degrees) if adjustable.   You will be instructed on further bracing after your first visit. Use crutches for comfort but you can put your full weight on the leg as tolerated.  REGIONAL ANESTHESIA (NERVE BLOCKS) - The anesthesia team may have performed a nerve block for you if safe in the setting of your care.  This is a great tool used to minimize pain.  Typically the block may start wearing off overnight.  This can be a challenging period but please utilize your as needed pain medications to try and  manage this period and know it will be a brief transition as the nerve block wears completely   POST-OP MEDICATIONS- Multimodal approach to pain control In general your pain will be controlled with a combination of substances.  Prescriptions unless otherwise discussed are electronically sent to your pharmacy.  This is a carefully made plan we use to minimize narcotic use.     Meloxicam - Anti-inflammatory medication taken on a scheduled basis Acetaminophen - Non-narcotic pain medicine taken on a scheduled basis  Oxycodone - This is a strong narcotic, to be used only on an "as needed" basis for SEVERE pain. Aspirin 81mg  - This medicine is used to minimize the risk of blood clots after surgery. Zofran - take as needed for nausea   FOLLOW-UP Please call the office to schedule a follow-up appointment for your incision check if you do not already have one, 7-10 days post-operatively. IF YOU HAVE ANY QUESTIONS, PLEASE FEEL FREE TO CALL OUR OFFICE.  HELPFUL INFORMATION  If you had a block, it will wear off between 8-24 hrs postop typically.  This is period when your pain may go from nearly zero to the pain you would have had post-op without the block.  This is an abrupt transition but nothing dangerous is happening.  You may take an extra dose of narcotic when this happens.  Keep your leg elevated to decrease swelling, which will  then in turn decrease your pain. I would elevate the foot of your bed by putting a couple of couch pillows between your mattress and box spring. I would not keep pillow directly under your ankle.  You must wear the brace locked while sleeping and ambulating until follow-up.   There will be MORE swelling on days 1-3 than there is on the day of surgery.  This also is normal. The swelling will decrease with the anti-inflammatory medication, ice and keeping it elevated. The swelling will make it more difficult to bend your knee. As the swelling goes down your motion will become  easier  You may develop swelling and bruising that extends from your knee down to your calf and perhaps even to your foot over the next week. Do not be alarmed. This too is normal, and it is due to gravity  There may be some numbness adjacent to the incision site. This may last for 6-12 months or longer in some patients and is expected.  You may return to sedentary work/school in the next couple of days when you feel up to it. You will need to keep your leg elevated as much as possible   You should wean off your narcotic medicines as soon as you are able.  Most patients will be off or using minimal narcotics before their first postop appointment.   We suggest you use the pain medication the first night prior to going to bed, in order to ease any pain when the anesthesia wears off. You should avoid taking pain medications on an empty stomach as it will make you nauseous.  Do not drink alcoholic beverages or take illicit drugs when taking pain medications.  It is against the law to drive while taking narcotics. You cannot drive if your Right leg is in brace locked in extension.  Pain medication may make you constipated.  Below are a few solutions to try in this order: Decrease the amount of pain medication if you aren't having pain. Drink lots of decaffeinated fluids. Drink prune juice and/or each dried prunes  If the first 3 don't work start with additional solutions Take Colace - an over-the-counter stool softener Take Senokot - an over-the-counter laxative Take Miralax - a stronger over-the-counter laxative  For more information including helpful videos and documents visit our website:   [email or web address]         Post Anesthesia Home Care Instructions  Activity: Get plenty of rest for the remainder of the day. A responsible individual must stay with you for 24 hours following the procedure.  For the next 24 hours, DO NOT: -Drive a  car -Advertising copywriter -Drink alcoholic beverages -Take any medication unless instructed by your physician -Make any legal decisions or sign important papers.  Meals: Start with liquid foods such as gelatin or soup. Progress to regular foods as tolerated. Avoid greasy, spicy, heavy foods. If nausea and/or vomiting occur, drink only clear liquids until the nausea and/or vomiting subsides. Call your physician if vomiting continues.  Special Instructions/Symptoms: Your throat may feel dry or sore from the anesthesia or the breathing tube placed in your throat during surgery. If this causes discomfort, gargle with warm salt water. The discomfort should disappear within 24 hours.  If you had a scopolamine patch placed behind your ear for the management of post- operative nausea and/or vomiting:  1. The medication in the patch is effective for 72 hours, after which it should be removed.  Wrap patch in a tissue and  discard in the trash. Wash hands thoroughly with soap and water. 2. You may remove the patch earlier than 72 hours if you experience unpleasant side effects which may include dry mouth, dizziness or visual disturbances. 3. Avoid touching the patch. Wash your hands with soap and water after contact with the patch.       Call your surgeon if you experience:   1.  Fever over 101.0. 2.  Inability to urinate. 3.  Nausea and/or vomiting. 4.  Extreme swelling or bruising at the surgical site. 5.  Continued bleeding from the incision. 6.  Increased pain, redness or drainage from the incision. 7.  Problems related to your pain medication. 8.  Any problems and/or concerns    NO IBUPROFEN UNTIL AFTER 10PM

## 2021-09-19 NOTE — Anesthesia Preprocedure Evaluation (Signed)
Anesthesia Evaluation  Patient identified by MRN, date of birth, ID band Patient awake    Reviewed: Allergy & Precautions, NPO status , Patient's Chart, lab work & pertinent test results  Airway Mallampati: II  TM Distance: >3 FB Neck ROM: Full    Dental  (+) Teeth Intact, Dental Advisory Given   Pulmonary Current Smoker,    Pulmonary exam normal breath sounds clear to auscultation       Cardiovascular negative cardio ROS Normal cardiovascular exam Rhythm:Regular Rate:Normal     Neuro/Psych PSYCHIATRIC DISORDERS Anxiety Depression negative neurological ROS     GI/Hepatic negative GI ROS, Neg liver ROS,   Endo/Other  negative endocrine ROS  Renal/GU negative Renal ROS     Musculoskeletal Right patella fracture    Abdominal   Peds  Hematology negative hematology ROS (+)   Anesthesia Other Findings Day of surgery medications reviewed with the patient.  Reproductive/Obstetrics                             Anesthesia Physical Anesthesia Plan  ASA: 2  Anesthesia Plan: General   Post-op Pain Management:    Induction: Intravenous  PONV Risk Score and Plan: 2  Airway Management Planned: LMA  Additional Equipment:   Intra-op Plan:   Post-operative Plan: Extubation in OR  Informed Consent: I have reviewed the patients History and Physical, chart, labs and discussed the procedure including the risks, benefits and alternatives for the proposed anesthesia with the patient or authorized representative who has indicated his/her understanding and acceptance.     Dental advisory given  Plan Discussed with: CRNA  Anesthesia Plan Comments:        Anesthesia Quick Evaluation

## 2021-09-19 NOTE — Interval H&P Note (Signed)
All questions answered, patient wants to proceed with procedure. ? ?

## 2021-09-19 NOTE — Transfer of Care (Signed)
Immediate Anesthesia Transfer of Care Note  Patient: Nichole Allen  Procedure(s) Performed: OPEN REDUCTION INTERNAL (ORIF) FIXATION PATELLA (Right: Knee)  Patient Location: PACU  Anesthesia Type:General  Level of Consciousness: awake, alert  and oriented  Airway & Oxygen Therapy: Patient Spontanous Breathing and Patient connected to face mask oxygen  Post-op Assessment: Report given to RN and Post -op Vital signs reviewed and stable  Post vital signs: Reviewed and stable  Last Vitals:  Vitals Value Taken Time  BP 105/65 09/19/21 1412  Temp    Pulse 69 09/19/21 1414  Resp 12 09/19/21 1414  SpO2 100 % 09/19/21 1414  Vitals shown include unvalidated device data.  Last Pain:  Vitals:   09/19/21 1122  TempSrc: Oral  PainSc: 0-No pain         Complications: No notable events documented.

## 2021-09-19 NOTE — Op Note (Signed)
Orthopaedic Surgery Operative Note (CSN: [id number])  Nichole Allen  07/19/1976 Date of Surgery: 09/19/2021   Diagnoses:  Right transverse patella fracture  Procedure: Right patella open reduction internal fixation   Operative Finding Successful completion of the planned procedure.  Patient's retinaculum medially and laterally were essentially intact.  Were able to clamp and secured the patella fracture taking around the [place] films to ensure that we did not perforate the articular surface.  We had great fixation.  We backed up our fixation with a figure-of-eight tension band as well as a cerclage suture in the form of fiber tape around the patella.  Post-operative plan: The patient will be weightbearing to tolerance in a knee immobilizer locked in extension transition to a hinged brace and following our extensor mechanism protocol.  The patient will be discharged home.  DVT prophylaxis Aspirin 81 mg twice daily for 6 weeks.   Pain control with PRN pain medication preferring oral medicines.  Follow up plan will be scheduled in approximately 7 days for incision check and XR.  Post-Op Diagnosis: Same Surgeons:Primary: Bjorn Pippin, MD Assistants:Caroline McBane PA-C Location: [facility] OR ROOM 2 Anesthesia: General with local anesthesia Antibiotics: Ancef 2 g with local vancomycin powder 1 g at the surgical site Tourniquet time:  Total Tourniquet Time Documented: Thigh (Right) - 41 minutes Total: Thigh (Right) - 41 minutes  Estimated Blood Loss: Minimal Complications: None Specimens: None Implants: Implant Name Type Inv. Item Serial No. Manufacturer Lot No. LRB No. Used Action  fibertape 92mm Orthopedic Implant   [employer] [id number] Right 1 Implanted  4.0 blunt tip screw 30 Screw   [employer]  Right 1 Implanted  SCREW BLUNT TIP [id number] - [id number] Screw SCREW BLUNT TIP [id number]  [employer]  Right 1 Implanted  WASHER - [id number] Orthopedic Implant WASHER  [employer]  Right 2 Implanted     Indications for Surgery:   Nichole Allen is a 45 y.o. female with patella fracture that was transverse and interrupted extensor mechanism.  Benefits and risks of operative and nonoperative management were discussed prior to surgery with patient/guardian(s) and informed consent form was completed.  Specific risks including infection, need for additional surgery, continued pain, nonunion, malunion and extensor mechanism disruption amongst others   Procedure:   The patient was identified properly. Informed consent was obtained and the surgical site was marked. The patient was taken up to suite where general anesthesia was induced.  The patient was positioned supine with leg over a bolster.  The right patella and knee was prepped and draped in the usual sterile fashion.  Timeout was performed before the beginning of the case.  Tourniquet was used for the above duration.  We made a medial parapatellar incision dissecting sharply through the skin overlying the patella.  We encountered the fascia overlying patella which was opened.  Carried our dissection subcutaneously flaps exposing the retinacular tears achieving hemostasis as we progressed.  We were able to excisionally debride hematoma from the fracture site and clear soft tissue to this facilitate and reduction.  fracture was essentially distracted but the overall alignment was reasonable.  We were able to clamp the fracture and reduce it anatomically.  At this point a combination of Weber clamps as well as point-to-point reduction clamps were used to hold anatomic reduction which was checked on fluoroscopy.  We were unable to put a finger on the articular surface as the retinaculum was intact and we did not want to disrupt it.  This  point we used fluoroscopy to place 2 K wires from the [employer] patella fracture set checking position on orthogonal fluoroscopy.  Were happy with her position and proceeded to place 2 partially threaded screws that  were appropriately measured.  We again checked our reduction and screw length and placement using fluoroscopy.  Were happy with our compression at the fracture site.  This point we used fiber tape and placed in a tension band fashion through the screws and tied this down with good tension on her sutures.  We then placed a cerclage suture of fiber tape around the outside of the patella itself and again tied this down.    At this point the incision was thoroughly irrigated and 1 g of vancomycin powder was placed locally before the retinacular repair was performed with multiple #1 Vicryl pops as well as #2 FiberWire.  Any rents in the tendon were closed with Vicryl from placement of screws.     We irrigated the wound copiously before placing local antibiotic as listed above.  We closed the incision in a multilayer fashion with absorbable suture.  Sterile dressing was placed.  Knee immobilizer was placed.  Patient was awoken taken to PACU in stable condition.  Alfonse Alpers, PA-C, present and scrubbed throughout the case, critical for completion in a timely fashion, and for retraction, instrumentation, closure.

## 2021-09-20 ENCOUNTER — Encounter (HOSPITAL_BASED_OUTPATIENT_CLINIC_OR_DEPARTMENT_OTHER): Payer: Self-pay | Admitting: Orthopaedic Surgery

## 2021-09-24 ENCOUNTER — Encounter: Payer: Self-pay | Admitting: Physical Therapy

## 2021-09-24 ENCOUNTER — Other Ambulatory Visit: Payer: Self-pay

## 2021-09-24 ENCOUNTER — Ambulatory Visit: Payer: Medicaid Other | Attending: Orthopaedic Surgery | Admitting: Physical Therapy

## 2021-09-24 DIAGNOSIS — S82034A Nondisplaced transverse fracture of right patella, initial encounter for closed fracture: Secondary | ICD-10-CM | POA: Insufficient documentation

## 2021-09-24 DIAGNOSIS — M6281 Muscle weakness (generalized): Secondary | ICD-10-CM | POA: Insufficient documentation

## 2021-09-24 DIAGNOSIS — R262 Difficulty in walking, not elsewhere classified: Secondary | ICD-10-CM | POA: Diagnosis present

## 2021-09-24 DIAGNOSIS — M25661 Stiffness of right knee, not elsewhere classified: Secondary | ICD-10-CM | POA: Diagnosis present

## 2021-09-24 DIAGNOSIS — R600 Localized edema: Secondary | ICD-10-CM | POA: Diagnosis present

## 2021-09-24 DIAGNOSIS — Z9889 Other specified postprocedural states: Secondary | ICD-10-CM | POA: Diagnosis present

## 2021-09-24 DIAGNOSIS — Z8781 Personal history of (healed) traumatic fracture: Secondary | ICD-10-CM | POA: Insufficient documentation

## 2021-09-25 NOTE — Therapy (Signed)
[facility] Outpatient Rehabilitation [facility] [address] [place], [place], [address] Phone: [phone]   Fax:  [phone]  Physical Therapy Evaluation  Patient Details  Name: Nichole Allen MRN: [id number] Date of Birth: [birth year] Referring Provider (PT): Dr. Ramond Marrow   Encounter Date: 09/24/2021   PT End of Session - 09/24/21 1623     Visit Number 1    Number of Visits 30    Date for PT Re-Evaluation 11/19/21    Authorization Type [employer]    Authorization - Number of Visits 12    PT Start Time 1525    PT Stop Time 1615    PT Time Calculation (min) 50 min    Equipment Utilized During Treatment Right knee immobilizer    Activity Tolerance Patient tolerated treatment well    Behavior During Therapy [facility] for tasks assessed/performed             Past Medical History:  Diagnosis Date   Allergy    Anxiety    Depression     Past Surgical History:  Procedure Laterality Date   CESAREAN SECTION     NASAL SEPTUM SURGERY     ORIF PATELLA Right 09/19/2021   Procedure: OPEN REDUCTION INTERNAL (ORIF) FIXATION PATELLA;  Surgeon: Bjorn Pippin, MD;  Location: [facility];  Service: Orthopedics;  Laterality: Right;    There were no vitals filed for this visit.    Subjective Assessment - 09/24/21 1536     Subjective Pt underwent surgery 09/19/21 ORIF patellar fracture.  Patient was taking the puppy out early in AM and she tripped and fell directly on Rt knee.  She is wearing knee immobilizer daily while awake.    Patient is accompained by: Family member    Limitations Sitting;Lifting;House hold activities;Standing;Other (comment)   work, ADLs   Patient Stated Goals Patient wants to be independent again    Currently in Pain? Yes    Pain Score 2     Pain Location Knee    Pain Orientation Right    Pain Descriptors / Indicators Discomfort;Pressure;Aching    Pain Type Surgical pain    Pain Onset 1 to 4 weeks ago    Pain Frequency  Intermittent    Aggravating Factors  knee ROM    Pain Relieving Factors resting, ice machine , pain meds    Multiple Pain Sites No                [facility] PT Assessment - 09/25/21 0001       Assessment   Medical Diagnosis Rt patellar ORIF    Referring Provider (PT) Dr. Ramond Marrow    Onset Date/Surgical Date 09/19/21    Next MD Visit 09/27/21    Prior Therapy No      Precautions   Precautions Knee    Required Braces or Orthoses Knee Immobilizer - Right;Other Brace/Splint    Other Brace/Splint pt needs extension lock brace      Restrictions   Weight Bearing Restrictions Yes    RLE Weight Bearing Weight bearing as tolerated    Other Position/Activity Restrictions with knee at 0 deg ext in locked brace      Balance Screen   Has the patient fallen in the past 6 months Yes    How many times? 1    Has the patient had a decrease in activity level because of a fear of falling?  No    Is the patient reluctant to leave their home  because of a fear of falling?  No      Home Environment   Living Environment Private residence    Living Arrangements Children    Available Help at Discharge Family    Type of Home House    Home Equipment Walker - 2 wheels;Crutches      Prior Function   Level of Independence Needs assistance with ADLs;Requires assistive device for independence;Needs assistance with homemaking;Needs assistance with transfers    Vocation Full time employment    Vocation Requirements hairdresser    Leisure likes to walk, always with the kids/sports , be outside      Cognition   Overall Cognitive Status Within Functional Limits for tasks assessed      Observation/Other Assessments   Skin Integrity steri strips in place      Observation/Other Assessments-Edema    Edema Circumferential      Circumferential Edema   Circumferential - Right 39 cm    Circumferential - Left  35 cm      Sensation   Light Touch Appears Intact      Coordination   Gross Motor Movements  are Fluid and Coordinated Not tested      Posture/Postural Control   Posture/Postural Control Postural limitations      AROM   Right Knee Flexion 20   with AAROM   Left Knee Extension 0    Left Knee Flexion 135      PROM   Overall PROM Comments Rt knee flexion to 25 deg      Strength   Overall Strength Comments NT on eval      Palpation   Patella mobility mobility present , edema/boggy    Palpation comment min tenderness peripatellar      Transfers   Comments min A for assist with leg      Ambulation/Gait   Ambulation Distance (Feet) 125 Feet    Assistive device Crutches    Gait Pattern Step-to pattern;Decreased stance time - left;Decreased hip/knee flexion - left;Left circumduction;Abducted- right    Ambulation Surface Level;Indoor                        Objective measurements completed on examination: See above findings.                PT Education - 09/25/21 0746     Education Details PT/POC, HEP, precautions, protocol, gait    Person(s) Educated Patient;Child(ren)    Methods Explanation;Handout    Comprehension Verbalized understanding;Returned demonstration;Verbal cues required;Need further instruction              PT Short Term Goals - 09/24/21 1640       PT SHORT TERM GOAL #1   Title Pt will be I with HEP for Rt LE ROM and strength and allowed by protocol    Baseline given on eval    Time 4    Period Weeks    Status New    Target Date 10/22/21      PT SHORT TERM GOAL #2   Title Pt will be able to flex Rt knee to > 50 deg for progression of protocol and transfers    Baseline AAROM to 20 deg, PROM to 25 deg    Time 4    Period Weeks    Status New    Target Date 10/22/21      PT SHORT TERM GOAL #3   Title Pt will be able to stand for ADLs  with brace locked for no increased pain    Baseline limited as she does not have a locked brace    Time 4    Period Weeks    Status New    Target Date 10/22/21      PT SHORT TERM  GOAL #4   Title Pt will be able to report use of ice for swelling, pain up to 3-5 times per day    Baseline has done this but has kept the immobilizer on    Time 4    Period Weeks    Status New    Target Date 10/22/21               PT Long Term Goals - 09/24/21 1643       PT LONG TERM GOAL #1   Title Pt will be I with final HEP upon discharge for long term strength    Baseline unknown    Time 8    Period Weeks    Status New    Target Date 11/20/21      PT LONG TERM GOAL #2   Title Pt will be able to show Rt knee extension to at least 4/5 for maximal gait stability and transfers.    Baseline unable to to test    Time 8    Period Weeks    Status New    Target Date 11/20/21      PT LONG TERM GOAL #3   Title Pt will demo Rt knee AROM 0- 120 deg for full ability to transfer and show optimal functional mobility    Baseline limited by protocol    Time 8    Period Weeks    Status New    Target Date 11/20/21      PT LONG TERM GOAL #4   Title Pt will be able to walk in the community with LRAD and min limp, pain < 3/10 for short errands, distances.    Baseline has not left her home before today since surgery    Time 8    Period Weeks    Status New    Target Date 11/20/21      PT LONG TERM GOAL #5   Title Pt will be able to negotiate 6 or more stairs with 1 rail or AD reciprocally with good technique and knee control.    Baseline does a very small step with crutch into her home    Time 8    Period Weeks    Status New    Target Date 11/20/21                    Plan - 09/25/21 0755     Clinical Impression Statement Patient presents for low complexity eval of Rt LE s/p patellar fracture with ORIF performed on 09/19/21 with Dr. Ramond Marrow.  She was not able to get her hinged brace upon discharge and has using her knee immobilizer.  She initiated basic knee exercises today. time spent correcting her gait and crutches and proper gait.  Emphasized limited WB on Rt  LE until she has the proper security in a hinged brace.    Examination-Activity Limitations Bathing;Carry;Lift;Stand;Sleep;Locomotion Level;Bend;Dressing;Reach Overhead;Squat;Caring for Others;Hygiene/Grooming;Stairs;Transfers;Toileting    Examination-Participation Restrictions Community Activity;Laundry;Occupation;Shop;Driving;Cleaning;Interpersonal Relationship;Meal Prep    Stability/Clinical Decision Making Stable/Uncomplicated    Clinical Decision Making Low    Rehab Potential Excellent    PT Frequency 2x / week    PT Duration 6 weeks  PT Treatment/Interventions ADLs/Self Care Home Management;Stair training;Therapeutic exercise;Passive range of motion;Manual techniques;Neuromuscular re-education;Therapeutic activities;Electrical Stimulation;Functional mobility training;Balance training;Patient/family education;Scar mobilization;Gait training;DME Instruction;Cryotherapy    PT Next Visit Plan 11/2 to 11/16: 0-30 deg Rt knee , prone knee flexion , gait, quad and HS set    PT Home Exercise Plan Access Code: [facility]  URL: [email or web address]  Date: 09/25/2021  Prepared by: Karie Mainland    Exercises  Supine Quad Set - 5 x daily - 7 x weekly - 2 sets - 10 reps - 5 hold  Supine Isometric Hamstring Set - 5 x daily - 7 x weekly - 2 sets - 10 reps - 5 hold  Supine Gluteal Sets - 5 x daily - 7 x weekly - 2 sets - 10 reps - 5 hold  Supine Active Ankle Pumps - 5 x daily - 7 x weekly - 2 sets - 10 reps - 5 hold    Consulted and Agree with Plan of Care Patient             Patient will benefit from skilled therapeutic intervention in order to improve the following deficits and impairments:  Abnormal gait, Decreased activity tolerance, Decreased endurance, Decreased knowledge of use of DME, Decreased range of motion, Decreased skin integrity, Decreased strength, Increased fascial restricitons, Pain, Postural dysfunction, Decreased mobility, Difficulty walking, Increased edema  Visit  Diagnosis: S/P ORIF (open reduction internal fixation) fracture  Nondisplaced transverse fracture of right patella, initial encounter for closed fracture  Difficulty in walking, not elsewhere classified  Stiffness of right knee, not elsewhere classified  Muscle weakness (generalized)  Localized edema     Problem List There are no problems to display for this patient.   Arrion Burruel, PT 09/25/2021, 10:48 AM  [facility] [address] [place], [place], [address] Phone: [phone]   Fax:  [phone]  Name: Nichole Allen MRN: [id number] Date of Birth: 1976/04/26  Check all possible CPT codes: 97110- Therapeutic Exercise, [phone]- Neuro Re-education, [phone] - Gait Training, [phone] - Manual Therapy, [phone] - Therapeutic Activities, [phone] - Self Care, [phone] - Electrical stimulation (unattended), [id number] - Electrical stimulation (Manual), and [id number] - Gillermina Hu, PT 09/25/21 10:48 AM Phone: [phone] Fax: [phone]

## 2021-09-25 NOTE — Patient Instructions (Signed)
Access Code: [facility] URL: [email or web address] Date: 09/25/2021 Prepared by: Karie Mainland  Exercises Supine Quad Set - 5 x daily - 7 x weekly - 2 sets - 10 reps - 5 hold Supine Isometric Hamstring Set - 5 x daily - 7 x weekly - 2 sets - 10 reps - 5 hold Supine Gluteal Sets - 5 x daily - 7 x weekly - 2 sets - 10 reps - 5 hold Supine Active Ankle Pumps - 5 x daily - 7 x weekly - 2 sets - 10 reps - 5 hold

## 2021-10-02 ENCOUNTER — Ambulatory Visit: Payer: Medicaid Other

## 2021-10-02 ENCOUNTER — Other Ambulatory Visit: Payer: Self-pay

## 2021-10-02 DIAGNOSIS — Z9889 Other specified postprocedural states: Secondary | ICD-10-CM | POA: Diagnosis not present

## 2021-10-02 DIAGNOSIS — S82034A Nondisplaced transverse fracture of right patella, initial encounter for closed fracture: Secondary | ICD-10-CM

## 2021-10-02 DIAGNOSIS — Z8781 Personal history of (healed) traumatic fracture: Secondary | ICD-10-CM

## 2021-10-02 DIAGNOSIS — M25661 Stiffness of right knee, not elsewhere classified: Secondary | ICD-10-CM

## 2021-10-02 DIAGNOSIS — M6281 Muscle weakness (generalized): Secondary | ICD-10-CM

## 2021-10-02 DIAGNOSIS — R600 Localized edema: Secondary | ICD-10-CM

## 2021-10-02 DIAGNOSIS — R262 Difficulty in walking, not elsewhere classified: Secondary | ICD-10-CM

## 2021-10-02 NOTE — Therapy (Signed)
[facility] Outpatient Rehabilitation [facility] [address] [place], [place], [address] Phone: [phone]   Fax:  [phone]  Physical Therapy Treatment  Patient Details  Name: Nichole Allen MRN: [id number] Date of Birth: 30-Jun-1976 Referring Provider (PT): Dr. Ramond Marrow   Encounter Date: 10/02/2021   PT End of Session - 10/02/21 1453     Visit Number 2    Number of Visits 30    Date for PT Re-Evaluation 11/19/21    Authorization Type [employer]    Authorization - Number of Visits 12    PT Start Time 1237    PT Stop Time 1317    PT Time Calculation (min) 40 min    Equipment Utilized During Treatment Right knee immobilizer    Activity Tolerance Patient tolerated treatment well    Behavior During Therapy [facility] for tasks assessed/performed             Past Medical History:  Diagnosis Date   Allergy    Anxiety    Depression     Past Surgical History:  Procedure Laterality Date   CESAREAN SECTION     NASAL SEPTUM SURGERY     ORIF PATELLA Right 09/19/2021   Procedure: OPEN REDUCTION INTERNAL (ORIF) FIXATION PATELLA;  Surgeon: Bjorn Pippin, MD;  Location: [facility];  Service: Orthopedics;  Laterality: Right;    There were no vitals filed for this visit.   Subjective Assessment - 10/02/21 1244     Subjective Pt reports she just got her hinged knee brace. She is using her crutches and trying to remember to walk correctly. This morning, it was sore so she iced it. She took some Tylenol before she came.    Patient is accompained by: Family member    Limitations Sitting;Lifting;House hold activities;Standing;Other (comment)   work, ADLs   Patient Stated Goals Patient wants to be independent again    Currently in Pain? No/denies    Pain Score 0-No pain    Pain Location Knee    Pain Orientation Right    Pain Onset 1 to 4 weeks ago                                        PT Education - 10/02/21  1436     Education Details extensive education on hinged knee brace, gait, taking care of surgical area allowing steri strips to come off versus taking them off, HEP importance    Person(s) Educated Patient;Child(ren)    Methods Explanation;Demonstration;Tactile cues;Verbal cues    Comprehension Verbalized understanding;Returned demonstration;Verbal cues required;Tactile cues required;Need further instruction              PT Short Term Goals - 09/24/21 1640       PT SHORT TERM GOAL #1   Title Pt will be I with HEP for Rt LE ROM and strength and allowed by protocol    Baseline given on eval    Time 4    Period Weeks    Status New    Target Date 10/22/21      PT SHORT TERM GOAL #2   Title Pt will be able to flex Rt knee to > 50 deg for progression of protocol and transfers    Baseline AAROM to 20 deg, PROM to 25 deg    Time 4    Period Weeks    Status New  Target Date 10/22/21      PT SHORT TERM GOAL #3   Title Pt will be able to stand for ADLs with brace locked for no increased pain    Baseline limited as she does not have a locked brace    Time 4    Period Weeks    Status New    Target Date 10/22/21      PT SHORT TERM GOAL #4   Title Pt will be able to report use of ice for swelling, pain up to 3-5 times per day    Baseline has done this but has kept the immobilizer on    Time 4    Period Weeks    Status New    Target Date 10/22/21               PT Long Term Goals - 09/24/21 1643       PT LONG TERM GOAL #1   Title Pt will be I with final HEP upon discharge for long term strength    Baseline unknown    Time 8    Period Weeks    Status New    Target Date 11/20/21      PT LONG TERM GOAL #2   Title Pt will be able to show Rt knee extension to at least 4/5 for maximal gait stability and transfers.    Baseline unable to to test    Time 8    Period Weeks    Status New    Target Date 11/20/21      PT LONG TERM GOAL #3   Title Pt will demo Rt knee  AROM 0- 120 deg for full ability to transfer and show optimal functional mobility    Baseline limited by protocol    Time 8    Period Weeks    Status New    Target Date 11/20/21      PT LONG TERM GOAL #4   Title Pt will be able to walk in the community with LRAD and min limp, pain < 3/10 for short errands, distances.    Baseline has not left her home before today since surgery    Time 8    Period Weeks    Status New    Target Date 11/20/21      PT LONG TERM GOAL #5   Title Pt will be able to negotiate 6 or more stairs with 1 rail or AD reciprocally with good technique and knee control.    Baseline does a very small step with crutch into her home    Time 8    Period Weeks    Status New    Target Date 11/20/21              [facility] Adult PT Treatment/Exercise:   Therapeutic Exercise:  -Reviewed HEP: long sitting quad sets, iso hamstring curl at 30, APs, and supine gluteal sets  Manual Therapy:  - gentle patellar mobs superior-inferior and medial-lateral - checking incision - palpating quad/VMO firing  Gait:  - Instructed pt in transfers with crutches to L side and R hand on EOT prior to stand and gait - Verbal cues for upright posture to decrease anterior lean, to WBAT using crutches to offset if needed with RLE leading    Self Care: see pt education    Plan - 10/02/21 1428     Clinical Impression Statement Pt presents with her son today and new hinged knee brace in the box.  She ambulates into session with pronounced anterior lean with B axillary crutches and soft knee brace. Pt is reportedly semi diligent with HEP, but still unable to perform quad set. There was improvement intra session, but a lot of compensation from glutes. No s/s of infection upon inspection of incision. Set pt's hinge brace from 0-30 and unlocked brace, per MD protocol. Instructed in gait training with crutches and hinge brace with improved upright posture. However, she does continue to self limit  weight bearing and demonstrates R LE hip hike during stane phase.    Examination-Activity Limitations Bathing;Carry;Lift;Stand;Sleep;Locomotion Level;Bend;Dressing;Reach Overhead;Squat;Caring for Others;Hygiene/Grooming;Stairs;Transfers;Toileting    Examination-Participation Restrictions Community Activity;Laundry;Occupation;Shop;Driving;Cleaning;Interpersonal Relationship;Meal Prep    Stability/Clinical Decision Making Stable/Uncomplicated    Rehab Potential Excellent    PT Frequency 2x / week    PT Duration 6 weeks    PT Treatment/Interventions ADLs/Self Care Home Management;Stair training;Therapeutic exercise;Passive range of motion;Manual techniques;Neuromuscular re-education;Therapeutic activities;Electrical Stimulation;Functional mobility training;Balance training;Patient/family education;Scar mobilization;Gait training;DME Instruction;Cryotherapy    PT Next Visit Plan 11/17: 0-60 deg Rt knee , prone knee flexion , gait, quad and HS set    PT Home Exercise Plan Access Code: [facility]  URL: [email or web address]  Date: 09/25/2021  Prepared by: Karie Mainland    Exercises  Supine Quad Set - 5 x daily - 7 x weekly - 2 sets - 10 reps - 5 hold  Supine Isometric Hamstring Set - 5 x daily - 7 x weekly - 2 sets - 10 reps - 5 hold  Supine Gluteal Sets - 5 x daily - 7 x weekly - 2 sets - 10 reps - 5 hold  Supine Active Ankle Pumps - 5 x daily - 7 x weekly - 2 sets - 10 reps - 5 hold    Consulted and Agree with Plan of Care Patient             Patient will benefit from skilled therapeutic intervention in order to improve the following deficits and impairments:  Abnormal gait, Decreased activity tolerance, Decreased endurance, Decreased knowledge of use of DME, Decreased range of motion, Decreased skin integrity, Decreased strength, Increased fascial restricitons, Pain, Postural dysfunction, Decreased mobility, Difficulty walking, Increased edema  Visit Diagnosis: S/P ORIF (open reduction  internal fixation) fracture  Nondisplaced transverse fracture of right patella, initial encounter for closed fracture  Difficulty in walking, not elsewhere classified  Stiffness of right knee, not elsewhere classified  Muscle weakness (generalized)  Localized edema     Problem List There are no problems to display for this patient.   Marcelline Mates, PT, DPT 10/02/2021, 2:54 PM  [facility] [address] [place], [place], [address] Phone: [phone]   Fax:  [phone]  Name: NDEA KILROY MRN: [id number] Date of Birth: 11-26-1975

## 2021-10-04 ENCOUNTER — Ambulatory Visit: Payer: Medicaid Other | Admitting: Physical Therapy

## 2021-10-04 ENCOUNTER — Encounter: Payer: Self-pay | Admitting: Physical Therapy

## 2021-10-04 ENCOUNTER — Other Ambulatory Visit: Payer: Self-pay

## 2021-10-04 DIAGNOSIS — M6281 Muscle weakness (generalized): Secondary | ICD-10-CM

## 2021-10-04 DIAGNOSIS — Z9889 Other specified postprocedural states: Secondary | ICD-10-CM | POA: Diagnosis not present

## 2021-10-04 DIAGNOSIS — S82034A Nondisplaced transverse fracture of right patella, initial encounter for closed fracture: Secondary | ICD-10-CM

## 2021-10-04 DIAGNOSIS — M25661 Stiffness of right knee, not elsewhere classified: Secondary | ICD-10-CM

## 2021-10-04 DIAGNOSIS — Z8781 Personal history of (healed) traumatic fracture: Secondary | ICD-10-CM

## 2021-10-04 DIAGNOSIS — R262 Difficulty in walking, not elsewhere classified: Secondary | ICD-10-CM

## 2021-10-04 DIAGNOSIS — R600 Localized edema: Secondary | ICD-10-CM

## 2021-10-05 NOTE — Therapy (Signed)
[facility] Outpatient Rehabilitation [facility] [address] [place], [place], [address] Phone: [phone]   Fax:  [phone]  Physical Therapy Treatment  Patient Details  Name: Nichole Allen MRN: [id number] Date of Birth: [birth year] Referring Provider (PT): Dr. Ramond Marrow   Encounter Date: 10/04/2021   PT End of Session - 10/04/21 1257     Visit Number 3    Number of Visits 30    Date for PT Re-Evaluation 11/19/21    Authorization Type [employer]    PT Start Time 1230    PT Stop Time 1315    PT Time Calculation (min) 45 min    Equipment Utilized During Treatment Other (comment)   Bledsoe   Activity Tolerance Patient tolerated treatment well    Behavior During Therapy [facility] for tasks assessed/performed;Anxious             Past Medical History:  Diagnosis Date   Allergy    Anxiety    Depression     Past Surgical History:  Procedure Laterality Date   CESAREAN SECTION     NASAL SEPTUM SURGERY     ORIF PATELLA Right 09/19/2021   Procedure: OPEN REDUCTION INTERNAL (ORIF) FIXATION PATELLA;  Surgeon: Bjorn Pippin, MD;  Location: [facility];  Service: Orthopedics;  Laterality: Right;    There were no vitals filed for this visit.   Subjective Assessment - 10/04/21 1236     Subjective Im wearing my brace but it wasnt locked, needs help locking it. She reports she has been squeezing her leg.    Currently in Pain? No/denies            [facility] Adult PT Treatment/Exercise:   Therapeutic Exercise: Quad sets x 20 Supine knee flexion with strap x 10, measure ROM  Hamstring set x 10  Prone knee flexion x 20 Seated heel slides x 20, hip elevated off surface Sidelying hip abduction x 20   Manual Therapy:   Neuromuscular re-ed: N/A   Therapeutic Activity: N/A   Modalities: N/A   Self Care: Donning brace, locking brace, protocol, how to adjust and secure brace. WBAT with brace locked.Gait.    Consider / progression for  next session: Prone -, knee flexion, hip extension Standing in parallel bars                PT Education - 10/04/21 1256     Education Details as previous about protocol and progression. BRACE, WBAT reiteration.    Person(s) Educated Patient;Child(ren)    Methods Explanation;Demonstration;Verbal cues;Handout    Comprehension Verbalized understanding;Tactile cues required;Need further instruction;Verbal cues required              PT Short Term Goals - 10/05/21 1530       PT SHORT TERM GOAL #1   Title Pt will be I with HEP for Rt LE ROM and strength and allowed by protocol    Baseline needs cues    Status On-going      PT SHORT TERM GOAL #2   Title Pt will be able to flex Rt knee to > 50 deg for progression of protocol and transfers    Baseline 52 deg AAROM flexion    Status Achieved      PT SHORT TERM GOAL #3   Title Pt will be able to stand for ADLs with brace locked for no increased pain    Status Achieved      PT SHORT TERM GOAL #4   Title  Pt will be able to report use of ice for swelling, pain up to 3-5 times per day    Status Achieved               PT Long Term Goals - 09/24/21 1643       PT LONG TERM GOAL #1   Title Pt will be I with final HEP upon discharge for long term strength    Baseline unknown    Time 8    Period Weeks    Status New    Target Date 11/20/21      PT LONG TERM GOAL #2   Title Pt will be able to show Rt knee extension to at least 4/5 for maximal gait stability and transfers.    Baseline unable to to test    Time 8    Period Weeks    Status New    Target Date 11/20/21      PT LONG TERM GOAL #3   Title Pt will demo Rt knee AROM 0- 120 deg for full ability to transfer and show optimal functional mobility    Baseline limited by protocol    Time 8    Period Weeks    Status New    Target Date 11/20/21      PT LONG TERM GOAL #4   Title Pt will be able to walk in the community with LRAD and min limp, pain < 3/10 for  short errands, distances.    Baseline has not left her home before today since surgery    Time 8    Period Weeks    Status New    Target Date 11/20/21      PT LONG TERM GOAL #5   Title Pt will be able to negotiate 6 or more stairs with 1 rail or AD reciprocally with good technique and knee control.    Baseline does a very small step with crutch into her home    Time 8    Period Weeks    Status New    Target Date 11/20/21                   Plan - 10/04/21 1301     Clinical Impression Statement Today patient inreased ROM on brace for active flexion to 60 deg.  She was able to bend her knee with AAROM to 52 deg.  Had some discomfort at hinge with bending, time spent educating anf readjusting brace. Able to go in prone for knee flexion.    Examination-Activity Limitations Bathing;Carry;Lift;Stand;Sleep;Locomotion Level;Bend;Dressing;Reach Overhead;Squat;Caring for Others;Hygiene/Grooming;Stairs;Transfers;Toileting    PT Treatment/Interventions ADLs/Self Care Home Management;Stair training;Therapeutic exercise;Passive range of motion;Manual techniques;Neuromuscular re-education;Therapeutic activities;Electrical Stimulation;Functional mobility training;Balance training;Patient/family education;Scar mobilization;Gait training;DME Instruction;Cryotherapy    PT Next Visit Plan 11/17: 0-60 deg Rt knee , prone knee flexion , gait, quad and HS set    PT Home Exercise Plan Access Code: [facility]  URL: [email or web address]  Date: 09/25/2021  Prepared by: Karie Mainland    Exercises  Supine Quad Set - 5 x daily - 7 x weekly - 2 sets - 10 reps - 5 hold  Supine Isometric Hamstring Set - 5 x daily - 7 x weekly - 2 sets - 10 reps - 5 hold  Supine Gluteal Sets - 5 x daily - 7 x weekly - 2 sets - 10 reps - 5 hold  Supine Active Ankle Pumps - 5 x daily - 7 x weekly - 2 sets - 10 reps - 5  hold    Consulted and Agree with Plan of Care Patient;Family member/caregiver    Family Member Consulted  daughter             Patient will benefit from skilled therapeutic intervention in order to improve the following deficits and impairments:  Abnormal gait, Decreased activity tolerance, Decreased endurance, Decreased knowledge of use of DME, Decreased range of motion, Decreased skin integrity, Decreased strength, Increased fascial restricitons, Pain, Postural dysfunction, Decreased mobility, Difficulty walking, Increased edema  Visit Diagnosis: S/P ORIF (open reduction internal fixation) fracture  Nondisplaced transverse fracture of right patella, initial encounter for closed fracture  Difficulty in walking, not elsewhere classified  Stiffness of right knee, not elsewhere classified  Muscle weakness (generalized)  Localized edema     Problem List There are no problems to display for this patient.   Sue Fernicola, PT 10/05/2021, 3:36 PM  [facility] [address] [place], [place], [address] Phone: [phone]   Fax:  [phone]  Name: Nichole Allen MRN: [id number] Date of Birth: 09-20-1976  Karie Mainland, PT 10/05/21 3:36 PM Phone: [phone] Fax: [phone]

## 2021-10-05 NOTE — Patient Instructions (Signed)
How to don brace.  Protocol , ROM increased to 0-60 deg open chain flexion Lock when walking with WBAT/.

## 2021-10-09 ENCOUNTER — Other Ambulatory Visit: Payer: Self-pay

## 2021-10-09 ENCOUNTER — Encounter: Payer: Self-pay | Admitting: Physical Therapy

## 2021-10-09 ENCOUNTER — Ambulatory Visit: Payer: Medicaid Other | Admitting: Physical Therapy

## 2021-10-09 DIAGNOSIS — S82034A Nondisplaced transverse fracture of right patella, initial encounter for closed fracture: Secondary | ICD-10-CM

## 2021-10-09 DIAGNOSIS — M6281 Muscle weakness (generalized): Secondary | ICD-10-CM

## 2021-10-09 DIAGNOSIS — R262 Difficulty in walking, not elsewhere classified: Secondary | ICD-10-CM

## 2021-10-09 DIAGNOSIS — Z9889 Other specified postprocedural states: Secondary | ICD-10-CM

## 2021-10-09 DIAGNOSIS — M25661 Stiffness of right knee, not elsewhere classified: Secondary | ICD-10-CM

## 2021-10-09 DIAGNOSIS — R600 Localized edema: Secondary | ICD-10-CM

## 2021-10-09 NOTE — Therapy (Signed)
[facility] Outpatient Rehabilitation [facility] [address] [place], [place], [address] Phone: [phone]   Fax:  [phone]  Physical Therapy Treatment  Patient Details  Name: Nichole Allen MRN: [id number] Date of Birth: 08/06/1976 Referring Provider (PT): Dr. Ramond Marrow   Encounter Date: 10/09/2021   PT End of Session - 10/09/21 1244     Visit Number 4    Number of Visits 30    Date for PT Re-Evaluation 11/19/21    Authorization Type [employer]    Authorization - Number of Visits 12    PT Start Time 1238    PT Stop Time 1316    PT Time Calculation (min) 38 min    Equipment Utilized During Treatment Other (comment)   kne brace Bledsoe   Activity Tolerance Patient tolerated treatment well    Behavior During Therapy [facility] for tasks assessed/performed             Past Medical History:  Diagnosis Date   Allergy    Anxiety    Depression     Past Surgical History:  Procedure Laterality Date   CESAREAN SECTION     NASAL SEPTUM SURGERY     ORIF PATELLA Right 09/19/2021   Procedure: OPEN REDUCTION INTERNAL (ORIF) FIXATION PATELLA;  Surgeon: Bjorn Pippin, MD;  Location: [facility];  Service: Orthopedics;  Laterality: Right;    There were no vitals filed for this visit.   Subjective Assessment - 10/09/21 1240     Subjective Pt working on things at home.  The brace slips down.  I think I walk better without the crutches.    Currently in Pain? Yes    Pain Score 1    with prone ex   Pain Location Knee    Pain Orientation Right;Anterior    Pain Descriptors / Indicators Tightness    Pain Onset 1 to 4 weeks ago    Pain Frequency Intermittent    Aggravating Factors  knee ROM    Pain Relieving Factors resting, elevation    Effect of Pain on Daily Activities unable to work    Multiple Pain Sites No              [facility] Adult PT Treatment/Exercise:  WEEK #3 Therapeutic Exercise: Prone knee flexion off table x 20 with brace  unlocked  Prone hip extension x 2 x 10  Prone quad set x 10  Supine quad set x 10 toes up and then toes out for VMO, used tactile and visual cues  Knee AAROM with strap and then heel slides on ball x 10 with PT assist to go back to extension  Hamstring set x 10 knee flexed to 55 deg  Seated hamstring and calf stretch with strap x 5, 30 sec   Flexion Up to 55 deg flexion AAROM   Manual Therapy:   Neuromuscular re-ed: N/A   Therapeutic Activity: N/A   Modalities: N/A   Self Care: Consider decreased frequency after holiday until week 6  Protocol and progression Wear at least the KI to bed at ngiht Walk with crutches esp in community    Consider / progression for next session:        PT Short Term Goals - 10/05/21 1530       PT SHORT TERM GOAL #1   Title Pt will be I with HEP for Rt LE ROM and strength and allowed by protocol    Baseline needs cues    Status On-going  PT SHORT TERM GOAL #2   Title Pt will be able to flex Rt knee to > 50 deg for progression of protocol and transfers    Baseline 52 deg AAROM flexion    Status Achieved      PT SHORT TERM GOAL #3   Title Pt will be able to stand for ADLs with brace locked for no increased pain    Status Achieved      PT SHORT TERM GOAL #4   Title Pt will be able to report use of ice for swelling, pain up to 3-5 times per day    Status Achieved               PT Long Term Goals - 09/24/21 1643       PT LONG TERM GOAL #1   Title Pt will be I with final HEP upon discharge for long term strength    Baseline unknown    Time 8    Period Weeks    Status New    Target Date 11/20/21      PT LONG TERM GOAL #2   Title Pt will be able to show Rt knee extension to at least 4/5 for maximal gait stability and transfers.    Baseline unable to to test    Time 8    Period Weeks    Status New    Target Date 11/20/21      PT LONG TERM GOAL #3   Title Pt will demo Rt knee AROM 0- 120 deg for full ability to  transfer and show optimal functional mobility    Baseline limited by protocol    Time 8    Period Weeks    Status New    Target Date 11/20/21      PT LONG TERM GOAL #4   Title Pt will be able to walk in the community with LRAD and min limp, pain < 3/10 for short errands, distances.    Baseline has not left her home before today since surgery    Time 8    Period Weeks    Status New    Target Date 11/20/21      PT LONG TERM GOAL #5   Title Pt will be able to negotiate 6 or more stairs with 1 rail or AD reciprocally with good technique and knee control.    Baseline does a very small step with crutch into her home    Time 8    Period Weeks    Status New    Target Date 11/20/21                   Plan - 10/09/21 1319     Clinical Impression Statement Patient tolerating AAROM and gentle PROM well.  She has flexion with about 55 deg in Rt knee, working on quad set and maintaining at home.  She needed less cues today for protocol and brace.  She has an appt next week and may consider reducing frequency of PT to 1 x until she is at the 6 week mark.  Tomorrow begins week 3.    PT Treatment/Interventions ADLs/Self Care Home Management;Stair training;Therapeutic exercise;Passive range of motion;Manual techniques;Neuromuscular re-education;Therapeutic activities;Electrical Stimulation;Functional mobility training;Balance training;Patient/family education;Scar mobilization;Gait training;DME Instruction;Cryotherapy    PT Next Visit Plan 11/17: 0-60 deg Rt knee , prone knee flexion , gait, quad and HS set    PT Home Exercise Plan Access Code: [facility]  URL: [email or web address]  Date: 09/25/2021  Prepared by: Karie Mainland    Exercises  Supine Quad Set - 5 x daily - 7 x weekly - 2 sets - 10 reps - 5 hold  Supine Isometric Hamstring Set - 5 x daily - 7 x weekly - 2 sets - 10 reps - 5 hold  Supine Gluteal Sets - 5 x daily - 7 x weekly - 2 sets - 10 reps - 5 hold  Supine Active Ankle  Pumps - 5 x daily - 7 x weekly - 2 sets - 10 reps - 5 hold    Consulted and Agree with Plan of Care Patient;Family member/caregiver             Patient will benefit from skilled therapeutic intervention in order to improve the following deficits and impairments:  Abnormal gait, Decreased activity tolerance, Decreased endurance, Decreased knowledge of use of DME, Decreased range of motion, Decreased skin integrity, Decreased strength, Increased fascial restricitons, Pain, Postural dysfunction, Decreased mobility, Difficulty walking, Increased edema  Visit Diagnosis: S/P ORIF (open reduction internal fixation) fracture  Nondisplaced transverse fracture of right patella, initial encounter for closed fracture  Difficulty in walking, not elsewhere classified  Stiffness of right knee, not elsewhere classified  Muscle weakness (generalized)  Localized edema     Problem List There are no problems to display for this patient.   Trenese Haft, PT 10/09/2021, 1:24 PM  [facility] [address] [place], [place], [address] Phone: [phone]   Fax:  [phone]  Name: Nichole Allen MRN: [id number] Date of Birth: [birth year]   Karie Mainland, PT 10/09/21 1:25 PM Phone: [phone] Fax: [phone]

## 2021-10-16 ENCOUNTER — Ambulatory Visit: Payer: Medicaid Other | Admitting: Physical Therapy

## 2021-10-18 ENCOUNTER — Ambulatory Visit: Payer: Medicaid Other | Admitting: Physical Therapy

## 2021-10-23 ENCOUNTER — Ambulatory Visit: Payer: Medicaid Other | Attending: Orthopaedic Surgery | Admitting: Physical Therapy

## 2021-10-23 ENCOUNTER — Other Ambulatory Visit: Payer: Self-pay

## 2021-10-23 ENCOUNTER — Encounter: Payer: Self-pay | Admitting: Physical Therapy

## 2021-10-23 DIAGNOSIS — Z9889 Other specified postprocedural states: Secondary | ICD-10-CM | POA: Insufficient documentation

## 2021-10-23 DIAGNOSIS — R262 Difficulty in walking, not elsewhere classified: Secondary | ICD-10-CM | POA: Diagnosis present

## 2021-10-23 DIAGNOSIS — S82034A Nondisplaced transverse fracture of right patella, initial encounter for closed fracture: Secondary | ICD-10-CM | POA: Diagnosis present

## 2021-10-23 DIAGNOSIS — M25661 Stiffness of right knee, not elsewhere classified: Secondary | ICD-10-CM | POA: Diagnosis present

## 2021-10-23 DIAGNOSIS — Z8781 Personal history of (healed) traumatic fracture: Secondary | ICD-10-CM

## 2021-10-23 DIAGNOSIS — M6281 Muscle weakness (generalized): Secondary | ICD-10-CM | POA: Diagnosis present

## 2021-10-23 DIAGNOSIS — R600 Localized edema: Secondary | ICD-10-CM | POA: Diagnosis present

## 2021-10-23 NOTE — Therapy (Signed)
[facility] Outpatient Rehabilitation [facility] [address] [place], [place], [address] Phone: [phone]   Fax:  [phone]  Physical Therapy Treatment  Patient Details  Name: Nichole Allen MRN: [id number] Date of Birth: August 23, 1976 Referring Provider (PT): Dr. Ramond Marrow   Encounter Date: 10/23/2021   PT End of Session - 10/23/21 1256     Visit Number 5    Number of Visits 30    Date for PT Re-Evaluation 11/19/21    Authorization Type [employer]    PT Start Time 1245   15 min late   PT Stop Time 1319    PT Time Calculation (min) 34 min    Activity Tolerance Patient tolerated treatment well    Behavior During Therapy [facility] for tasks assessed/performed             Past Medical History:  Diagnosis Date   Allergy    Anxiety    Depression     Past Surgical History:  Procedure Laterality Date   CESAREAN SECTION     NASAL SEPTUM SURGERY     ORIF PATELLA Right 09/19/2021   Procedure: OPEN REDUCTION INTERNAL (ORIF) FIXATION PATELLA;  Surgeon: Bjorn Pippin, MD;  Location: [facility];  Service: Orthopedics;  Laterality: Right;    There were no vitals filed for this visit.   Subjective Assessment - 10/23/21 1248     Subjective Pt missed last visit to due illness.  Walks in with brace in 0 deg and crutches.  We had a phone conversation last week and she is cleared to unlock brace to 90 deg. when she is NOT weightbearing.  No pain .    Currently in Pain? No/denies                    [facility] Adult PT Treatment/Exercise:   Therapeutic Exercise: Seated AAROM Rt knee, sliding heel on towel brace on initially , up to 70 deg  AAROM  Supine quad set x 15 Supine hamstring set x 15  Bridge knees bent x 15 with bolster  Bridge knee ext on bolster x 15  Prone knee flexion  x 15  Prone knee AAROM with strap, also done in prone with strap 30 sec x 5 each  Prone hip flexion bent knee x 10 each LE    Manual Therapy:  patellar mobs  , brief  Neuromuscular re-ed: N/A   Therapeutic Activity: N/A   Modalities: N/A   Self Care: N/A   Consider / progression for next session:    SLR with brace in abduction (not allowed flexion?) , consider clam as well  PT Education - 10/23/21 1528     Education Details HEP, progression of protocol    Person(s) Educated Patient    Methods Explanation;Demonstration;Verbal cues;Handout    Comprehension Verbalized understanding              PT Short Term Goals - 10/23/21 1529       PT SHORT TERM GOAL #1   Title Pt will be I with HEP for Rt LE ROM and strength and allowed by protocol    Status On-going      PT SHORT TERM GOAL #2   Title Pt will be able to flex Rt knee to > 50 deg for progression of protocol and transfers    Baseline 70 deg AAROM    Status Achieved      PT SHORT TERM GOAL #3   Title Pt will be  able to stand for ADLs with brace locked for no increased pain    Status Achieved      PT SHORT TERM GOAL #4   Title Pt will be able to report use of ice for swelling, pain up to 3-5 times per day    Status Achieved               PT Long Term Goals - 09/24/21 1643       PT LONG TERM GOAL #1   Title Pt will be I with final HEP upon discharge for long term strength    Baseline unknown    Time 8    Period Weeks    Status New    Target Date 11/20/21      PT LONG TERM GOAL #2   Title Pt will be able to show Rt knee extension to at least 4/5 for maximal gait stability and transfers.    Baseline unable to to test    Time 8    Period Weeks    Status New    Target Date 11/20/21      PT LONG TERM GOAL #3   Title Pt will demo Rt knee AROM 0- 120 deg for full ability to transfer and show optimal functional mobility    Baseline limited by protocol    Time 8    Period Weeks    Status New    Target Date 11/20/21      PT LONG TERM GOAL #4   Title Pt will be able to walk in the community with LRAD and min limp, pain < 3/10 for short errands,  distances.    Baseline has not left her home before today since surgery    Time 8    Period Weeks    Status New    Target Date 11/20/21      PT LONG TERM GOAL #5   Title Pt will be able to negotiate 6 or more stairs with 1 rail or AD reciprocally with good technique and knee control.    Baseline does a very small step with crutch into her home    Time 8    Period Weeks    Status New    Target Date 11/20/21                   Plan - 10/23/21 1256     Clinical Impression Statement Patient did well with brace unlocked to 90 deg at this 5 week mark. She sees MD 12/15.  She still asks questions about protocol and brace and allowances. Her quad set is improving but compensates with glutes. Stayed conservative with knee flexion passively per protocol.  Needs cues throughout for proper execution of exercises.    PT Treatment/Interventions ADLs/Self Care Home Management;Stair training;Therapeutic exercise;Passive range of motion;Manual techniques;Neuromuscular re-education;Therapeutic activities;Electrical Stimulation;Functional mobility training;Balance training;Patient/family education;Scar mobilization;Gait training;DME Instruction;Cryotherapy    PT Next Visit Plan 12/6: 0-90 deg Rt knee , prone knee flexion , gait, quad and HS set.  Review and progress.    PT Home Exercise Plan Access Code: [facility]  URL: [email or web address]  Date: 09/25/2021  Prepared by: Karie Mainland    Exercises  Supine Quad Set - 5 x daily - 7 x weekly - 2 sets - 10 reps - 5 hold  Supine Isometric Hamstring Set - 5 x daily - 7 x weekly - 2 sets - 10 reps - 5 hold  Supine Gluteal Sets - 5 x daily - 7  x weekly - 2 sets - 10 reps - 5 hold  Supine Active Ankle Pumps - 5 x daily - 7 x weekly - 2 sets - 10 reps - 5 hold    Consulted and Agree with Plan of Care Patient;Family member/caregiver             Patient will benefit from skilled therapeutic intervention in order to improve the following  deficits and impairments:  Abnormal gait, Decreased activity tolerance, Decreased endurance, Decreased knowledge of use of DME, Decreased range of motion, Decreased skin integrity, Decreased strength, Increased fascial restricitons, Pain, Postural dysfunction, Decreased mobility, Difficulty walking, Increased edema  Visit Diagnosis: S/P ORIF (open reduction internal fixation) fracture  Nondisplaced transverse fracture of right patella, initial encounter for closed fracture  Difficulty in walking, not elsewhere classified  Stiffness of right knee, not elsewhere classified  Muscle weakness (generalized)  Localized edema     Problem List There are no problems to display for this patient.   Shimika Ames, PT 10/23/2021, 3:35 PM  [facility] [address] [place], [place], [address] Phone: [phone]   Fax:  [phone]  Name: Nichole Allen MRN: [id number] Date of Birth: [birth year]  Karie Mainland, PT 10/23/21 3:35 PM Phone: [phone] Fax: [phone]

## 2021-10-25 ENCOUNTER — Other Ambulatory Visit: Payer: Self-pay

## 2021-10-25 ENCOUNTER — Encounter: Payer: Self-pay | Admitting: Physical Therapy

## 2021-10-25 ENCOUNTER — Ambulatory Visit: Payer: Medicaid Other | Admitting: Physical Therapy

## 2021-10-25 DIAGNOSIS — Z9889 Other specified postprocedural states: Secondary | ICD-10-CM | POA: Diagnosis not present

## 2021-10-25 DIAGNOSIS — S82034A Nondisplaced transverse fracture of right patella, initial encounter for closed fracture: Secondary | ICD-10-CM

## 2021-10-25 DIAGNOSIS — R262 Difficulty in walking, not elsewhere classified: Secondary | ICD-10-CM

## 2021-10-25 NOTE — Therapy (Signed)
[facility] Outpatient Rehabilitation [facility] [address] [place], [place], [address] Phone: [phone]   Fax:  [phone]  Physical Therapy Treatment  Patient Details  Name: Nichole Allen MRN: [id number] Date of Birth: 28-Jul-1976 Referring Provider (PT): Dr. Ramond Marrow   Encounter Date: 10/25/2021   PT End of Session - 10/25/21 1319     Visit Number 6    Number of Visits 30    Date for PT Re-Evaluation 11/19/21    Authorization Type [employer]- no auth for first 12 visits    Authorization - Number of Visits 12    PT Start Time 1315    PT Stop Time 1355    PT Time Calculation (min) 40 min             Past Medical History:  Diagnosis Date   Allergy    Anxiety    Depression     Past Surgical History:  Procedure Laterality Date   CESAREAN SECTION     NASAL SEPTUM SURGERY     ORIF PATELLA Right 09/19/2021   Procedure: OPEN REDUCTION INTERNAL (ORIF) FIXATION PATELLA;  Surgeon: Bjorn Pippin, MD;  Location: [facility];  Service: Orthopedics;  Laterality: Right;    There were no vitals filed for this visit.   Subjective Assessment - 10/25/21 1316     Subjective No pain.    Currently in Pain? No/denies             [facility] Adult PT Treatment/Exercise:   Therapeutic Exercise: Seated AAROM Rt knee, sliding heel on towel brace on initially , up to 70 deg  AAROM  SLR in brace locked using strap 10 x 2 Side hip abduction in brace 10 x2  Supine quad set x 10 sec x 10- towel under knee  Supine assisted SLR x 5   Prone knee flexion  x 10 x 2 - 1 set with assist from opp LE  Prone knee AAROM with strap- 68 degrees Seated heel slides with towel x 10 to quad set Seated SLR x 10     Manual Therapy:  patellar mobs ,   Neuromuscular re-ed: N/A   Therapeutic Activity: N/A   Modalities: N/A   Self Care: N/A   Consider / progression for next session:   [facility] PT Assessment - 10/25/21 0001       AROM   Right Knee  Flexion 65   AAROM                PT Short Term Goals - 10/23/21 1529       PT SHORT TERM GOAL #1   Title Pt will be I with HEP for Rt LE ROM and strength and allowed by protocol    Status On-going      PT SHORT TERM GOAL #2   Title Pt will be able to flex Rt knee to > 50 deg for progression of protocol and transfers    Baseline 70 deg AAROM    Status Achieved      PT SHORT TERM GOAL #3   Title Pt will be able to stand for ADLs with brace locked for no increased pain    Status Achieved      PT SHORT TERM GOAL #4   Title Pt will be able to report use of ice for swelling, pain up to 3-5 times per day    Status Achieved  PT Long Term Goals - 09/24/21 1643       PT LONG TERM GOAL #1   Title Pt will be I with final HEP upon discharge for long term strength    Baseline unknown    Time 8    Period Weeks    Status New    Target Date 11/20/21      PT LONG TERM GOAL #2   Title Pt will be able to show Rt knee extension to at least 4/5 for maximal gait stability and transfers.    Baseline unable to to test    Time 8    Period Weeks    Status New    Target Date 11/20/21      PT LONG TERM GOAL #3   Title Pt will demo Rt knee AROM 0- 120 deg for full ability to transfer and show optimal functional mobility    Baseline limited by protocol    Time 8    Period Weeks    Status New    Target Date 11/20/21      PT LONG TERM GOAL #4   Title Pt will be able to walk in the community with LRAD and min limp, pain < 3/10 for short errands, distances.    Baseline has not left her home before today since surgery    Time 8    Period Weeks    Status New    Target Date 11/20/21      PT LONG TERM GOAL #5   Title Pt will be able to negotiate 6 or more stairs with 1 rail or AD reciprocally with good technique and knee control.    Baseline does a very small step with crutch into her home    Time 8    Period Weeks    Status New    Target Date 11/20/21                    Plan - 10/25/21 1413     Clinical Impression Statement Pt arrives with brace and bilateral crutches. Continued with quad and hamstring activation. Performed assisted SLR with and without brace. Used strap for supine heel slides achieving 68degrees max knee flexion. Fair quad contraction and mild lag of quad present. Encouraged more work on ROM to achieve 90 on schedule with protocol.    PT Treatment/Interventions ADLs/Self Care Home Management;Stair training;Therapeutic exercise;Passive range of motion;Manual techniques;Neuromuscular re-education;Therapeutic activities;Electrical Stimulation;Functional mobility training;Balance training;Patient/family education;Scar mobilization;Gait training;DME Instruction;Cryotherapy    PT Next Visit Plan 12/6: 0-90 deg Rt knee , prone knee flexion , gait, quad and HS set.  Review and progress.    PT Home Exercise Plan Access Code: [facility]  URL: [email or web address]  Date: 09/25/2021  Prepared by: Karie Mainland    Exercises  Supine Quad Set - 5 x daily - 7 x weekly - 2 sets - 10 reps - 5 hold  Supine Isometric Hamstring Set - 5 x daily - 7 x weekly - 2 sets - 10 reps - 5 hold  Supine Gluteal Sets - 5 x daily - 7 x weekly - 2 sets - 10 reps - 5 hold  Supine Active Ankle Pumps - 5 x daily - 7 x weekly - 2 sets - 10 reps - 5 hold    Consulted and Agree with Plan of Care Patient;Family member/caregiver    Family Member Consulted son             Patient will benefit from skilled therapeutic  intervention in order to improve the following deficits and impairments:  Abnormal gait, Decreased activity tolerance, Decreased endurance, Decreased knowledge of use of DME, Decreased range of motion, Decreased skin integrity, Decreased strength, Increased fascial restricitons, Pain, Postural dysfunction, Decreased mobility, Difficulty walking, Increased edema  Visit Diagnosis: S/P ORIF (open reduction internal fixation)  fracture  Nondisplaced transverse fracture of right patella, initial encounter for closed fracture  Difficulty in walking, not elsewhere classified     Problem List There are no problems to display for this patient.   Sherrie Mustache, [place] 10/25/2021, 2:16 PM  [facility] [address] [place], [place], [address] Phone: [phone]   Fax:  [phone]  Name: Nichole Allen MRN: [id number] Date of Birth: May 12, 1976

## 2021-10-30 ENCOUNTER — Encounter: Payer: Self-pay | Admitting: Physical Therapy

## 2021-10-30 ENCOUNTER — Other Ambulatory Visit: Payer: Self-pay

## 2021-10-30 ENCOUNTER — Ambulatory Visit: Payer: Medicaid Other | Admitting: Physical Therapy

## 2021-10-30 DIAGNOSIS — Z9889 Other specified postprocedural states: Secondary | ICD-10-CM | POA: Diagnosis not present

## 2021-10-30 DIAGNOSIS — S82034A Nondisplaced transverse fracture of right patella, initial encounter for closed fracture: Secondary | ICD-10-CM

## 2021-10-30 DIAGNOSIS — R262 Difficulty in walking, not elsewhere classified: Secondary | ICD-10-CM

## 2021-10-30 DIAGNOSIS — R600 Localized edema: Secondary | ICD-10-CM

## 2021-10-30 DIAGNOSIS — M6281 Muscle weakness (generalized): Secondary | ICD-10-CM

## 2021-10-30 DIAGNOSIS — M25661 Stiffness of right knee, not elsewhere classified: Secondary | ICD-10-CM

## 2021-10-30 DIAGNOSIS — Z8781 Personal history of (healed) traumatic fracture: Secondary | ICD-10-CM

## 2021-10-30 NOTE — Therapy (Signed)
[facility] Outpatient Rehabilitation [facility] [address] [place], [place], [address] Phone: [phone]   Fax:  [phone]  Physical Therapy Treatment  Patient Details  Name: Nichole Allen MRN: [id number] Date of Birth: 1976-05-05 Referring Provider (PT): Dr. Ramond Marrow   Encounter Date: 10/30/2021   PT End of Session - 10/30/21 1241     Visit Number 7    Number of Visits 30    Date for PT Re-Evaluation 11/19/21    Authorization Type [employer]- no auth for first 12 visits    PT Start Time 1236    PT Stop Time 1321    PT Time Calculation (min) 45 min    Activity Tolerance Patient tolerated treatment well    Behavior During Therapy [facility] for tasks assessed/performed             Past Medical History:  Diagnosis Date   Allergy    Anxiety    Depression     Past Surgical History:  Procedure Laterality Date   CESAREAN SECTION     NASAL SEPTUM SURGERY     ORIF PATELLA Right 09/19/2021   Procedure: OPEN REDUCTION INTERNAL (ORIF) FIXATION PATELLA;  Surgeon: Bjorn Pippin, MD;  Location: [facility];  Service: Orthopedics;  Laterality: Right;    There were no vitals filed for this visit.   Subjective Assessment - 10/30/21 1238     Subjective Just got back from the doctor.  Is weightbearing with brace unlocked to 30 deg.  No pain but I can feel it when I bend it.    Currently in Pain? No/denies            [facility] Adult PT Treatment/Exercise:   Therapeutic Exercise: Supine quad activation  Isometric quad sets x 10  Short arc quad 2 x 10 , pain moderate SLR small ROM 2 x 10   hamstring set x 10 Supine Rt LE off table for Rt ant hip and Rt quad stretch , PROM  Rt hip abduction 2 x 10   Small hip ext and flexion (sidekick  x 10) with knee full extended  NuStep L 3 UE assisting for ROM for 5 min    Manual Therapy:  PROM Rt knee flexion supine leg off table  Consider / progression for next session:    Cont ROM and  strength Rt LE open chain              PT Short Term Goals - 10/23/21 1529       PT SHORT TERM GOAL #1   Title Pt will be I with HEP for Rt LE ROM and strength and allowed by protocol    Status On-going      PT SHORT TERM GOAL #2   Title Pt will be able to flex Rt knee to > 50 deg for progression of protocol and transfers    Baseline 70 deg AAROM    Status Achieved      PT SHORT TERM GOAL #3   Title Pt will be able to stand for ADLs with brace locked for no increased pain    Status Achieved      PT SHORT TERM GOAL #4   Title Pt will be able to report use of ice for swelling, pain up to 3-5 times per day    Status Achieved               PT Long Term Goals - 09/24/21 1643  PT LONG TERM GOAL #1   Title Pt will be I with final HEP upon discharge for long term strength    Baseline unknown    Time 8    Period Weeks    Status New    Target Date 11/20/21      PT LONG TERM GOAL #2   Title Pt will be able to show Rt knee extension to at least 4/5 for maximal gait stability and transfers.    Baseline unable to to test    Time 8    Period Weeks    Status New    Target Date 11/20/21      PT LONG TERM GOAL #3   Title Pt will demo Rt knee AROM 0- 120 deg for full ability to transfer and show optimal functional mobility    Baseline limited by protocol    Time 8    Period Weeks    Status New    Target Date 11/20/21      PT LONG TERM GOAL #4   Title Pt will be able to walk in the community with LRAD and min limp, pain < 3/10 for short errands, distances.    Baseline has not left her home before today since surgery    Time 8    Period Weeks    Status New    Target Date 11/20/21      PT LONG TERM GOAL #5   Title Pt will be able to negotiate 6 or more stairs with 1 rail or AD reciprocally with good technique and knee control.    Baseline does a very small step with crutch into her home    Time 8    Period Weeks    Status New    Target Date 11/20/21                    Plan - 10/30/21 1241     Clinical Impression Statement Patient now at week 6 and saw MD who advanced her in the protocol.  She had pain with active quad extension (SAQ). Initiated NuStep for ROM able to get to 77 deg flexion.    PT Treatment/Interventions ADLs/Self Care Home Management;Stair training;Therapeutic exercise;Passive range of motion;Manual techniques;Neuromuscular re-education;Therapeutic activities;Electrical Stimulation;Functional mobility training;Balance training;Patient/family education;Scar mobilization;Gait training;DME Instruction;Cryotherapy    PT Next Visit Plan [phone]: upgrade HEP 0-90 deg Rt knee locked to 30 def with full WB.  Cont prone knee flexion , gait, quad and HS set.  Review and progress.    PT Home Exercise Plan Access Code: [facility]  URL: [email or web address]  Date: 09/25/2021  Prepared by: Karie Mainland    Exercises  Supine Quad Set - 5 x daily - 7 x weekly - 2 sets - 10 reps - 5 hold  Supine Isometric Hamstring Set - 5 x daily - 7 x weekly - 2 sets - 10 reps - 5 hold  Supine Gluteal Sets - 5 x daily - 7 x weekly - 2 sets - 10 reps - 5 hold  Supine Active Ankle Pumps - 5 x daily - 7 x weekly - 2 sets - 10 reps - 5 hold    Consulted and Agree with Plan of Care Patient;Family member/caregiver    Family Member Consulted son             Patient will benefit from skilled therapeutic intervention in order to improve the following deficits and impairments:  Abnormal gait, Decreased activity tolerance, Decreased endurance, Decreased knowledge of  use of DME, Decreased range of motion, Decreased skin integrity, Decreased strength, Increased fascial restricitons, Pain, Postural dysfunction, Decreased mobility, Difficulty walking, Increased edema  Visit Diagnosis: S/P ORIF (open reduction internal fixation) fracture  Nondisplaced transverse fracture of right patella, initial encounter for closed fracture  Difficulty in walking, not  elsewhere classified  Stiffness of right knee, not elsewhere classified  Muscle weakness (generalized)  Localized edema     Problem List There are no problems to display for this patient.   Deaaron Fulghum, PT 10/30/2021, 1:23 PM  [facility] [address] [place], [place], [address] Phone: [phone]   Fax:  [phone]  Name: Nichole Allen MRN: [id number] Date of Birth: 1976-11-07   Karie Mainland, PT 10/30/21 1:23 PM Phone: [phone] Fax: [phone]

## 2021-11-01 ENCOUNTER — Ambulatory Visit: Payer: Medicaid Other | Admitting: Physical Therapy

## 2021-11-01 ENCOUNTER — Other Ambulatory Visit: Payer: Self-pay

## 2021-11-01 ENCOUNTER — Encounter: Payer: Self-pay | Admitting: Physical Therapy

## 2021-11-01 DIAGNOSIS — M6281 Muscle weakness (generalized): Secondary | ICD-10-CM

## 2021-11-01 DIAGNOSIS — Z8781 Personal history of (healed) traumatic fracture: Secondary | ICD-10-CM

## 2021-11-01 DIAGNOSIS — R600 Localized edema: Secondary | ICD-10-CM

## 2021-11-01 DIAGNOSIS — Z9889 Other specified postprocedural states: Secondary | ICD-10-CM | POA: Diagnosis not present

## 2021-11-01 DIAGNOSIS — M25661 Stiffness of right knee, not elsewhere classified: Secondary | ICD-10-CM

## 2021-11-01 DIAGNOSIS — R262 Difficulty in walking, not elsewhere classified: Secondary | ICD-10-CM

## 2021-11-01 DIAGNOSIS — S82034A Nondisplaced transverse fracture of right patella, initial encounter for closed fracture: Secondary | ICD-10-CM

## 2021-11-01 NOTE — Therapy (Signed)
[name] Outpatient Rehabilitation [name] [address] [place], [place], [address] Phone: [phone]   Fax:  [phone]  Physical Therapy Treatment  Patient Details  Name: Nichole Allen MRN: [id number] Date of Birth: 1976/07/05 Referring Provider (PT): Dr. Ramond Marrow   Encounter Date: 11/01/2021   PT End of Session - 11/01/21 1251     Visit Number 8    Number of Visits 30    Date for PT Re-Evaluation 11/19/21    Authorization Type [employer]- no auth for first 12 visits    PT Start Time 1236    PT Stop Time 1320    PT Time Calculation (min) 44 min    Activity Tolerance Patient tolerated treatment well    Behavior During Therapy [facility] for tasks assessed/performed             Past Medical History:  Diagnosis Date   Allergy    Anxiety    Depression     Past Surgical History:  Procedure Laterality Date   CESAREAN SECTION     NASAL SEPTUM SURGERY     ORIF PATELLA Right 09/19/2021   Procedure: OPEN REDUCTION INTERNAL (ORIF) FIXATION PATELLA;  Surgeon: Bjorn Pippin, MD;  Location: [facility];  Service: Orthopedics;  Laterality: Right;    There were no vitals filed for this visit.   Subjective Assessment - 11/01/21 1250     Subjective Walks in without crutches.  Reports she has been working on her exercises a little.    Currently in Pain? No/denies              [facility] Adult PT Treatment/Exercise:   Therapeutic Exercise: NuStep L5 UE and LE for 6 min  Seated LAQ  x 2 x 10 with A from LLE  Hamstring curl light yellow band x 15 Supine AAROM flexion to 81 deg.  X 5  SAQ 2 x 10  SLR 2 x 10  Adduction ball squeeze x 10  Sit to stand x 10  Standing hip abduction x 15 each LE Calf raises x 15        PT Education - 11/01/21 1257     Education Details upgraded HEP, quad anatomy    Person(s) Educated Patient    Methods Explanation;Demonstration;Handout    Comprehension Verbalized understanding;Returned  demonstration              PT Short Term Goals - 10/23/21 1529       PT SHORT TERM GOAL #1   Title Pt will be I with HEP for Rt LE ROM and strength and allowed by protocol    Status On-going      PT SHORT TERM GOAL #2   Title Pt will be able to flex Rt knee to > 50 deg for progression of protocol and transfers    Baseline 70 deg AAROM    Status Achieved      PT SHORT TERM GOAL #3   Title Pt will be able to stand for ADLs with brace locked for no increased pain    Status Achieved      PT SHORT TERM GOAL #4   Title Pt will be able to report use of ice for swelling, pain up to 3-5 times per day    Status Achieved               PT Long Term Goals - 09/24/21 1643       PT LONG TERM GOAL #1  Title Pt will be I with final HEP upon discharge for long term strength    Baseline unknown    Time 8    Period Weeks    Status New    Target Date 11/20/21      PT LONG TERM GOAL #2   Title Pt will be able to show Rt knee extension to at least 4/5 for maximal gait stability and transfers.    Baseline unable to to test    Time 8    Period Weeks    Status New    Target Date 11/20/21      PT LONG TERM GOAL #3   Title Pt will demo Rt knee AROM 0- 120 deg for full ability to transfer and show optimal functional mobility    Baseline limited by protocol    Time 8    Period Weeks    Status New    Target Date 11/20/21      PT LONG TERM GOAL #4   Title Pt will be able to walk in the community with LRAD and min limp, pain < 3/10 for short errands, distances.    Baseline has not left her home before today since surgery    Time 8    Period Weeks    Status New    Target Date 11/20/21      PT LONG TERM GOAL #5   Title Pt will be able to negotiate 6 or more stairs with 1 rail or AD reciprocally with good technique and knee control.    Baseline does a very small step with crutch into her home    Time 8    Period Weeks    Status New    Target Date 11/20/21                    Plan - 11/01/21 1252     Clinical Impression Statement Patient continues to increase AAROM of Rt knee in flexion as well as quad activation.  Pain min overall but weak and needs encouragement to complete exercises. Asked her to perform HEP 2-3 times per day for max effect.    PT Treatment/Interventions ADLs/Self Care Home Management;Stair training;Therapeutic exercise;Passive range of motion;Manual techniques;Neuromuscular re-education;Therapeutic activities;Electrical Stimulation;Functional mobility training;Balance training;Patient/family education;Scar mobilization;Gait training;DME Instruction;Cryotherapy    PT Next Visit Plan [phone]: upgrade HEP 0-90 deg Rt knee locked to 30 def with full WB.  Cont prone knee flexion , gait, quad and HS set.  Review and progress.    PT Home Exercise Plan Access Code: [email or web address] Code: [email or web address]  URL: [email or web address]  Date: 11/01/2021  Prepared by: Karie Mainland    Exercises  Supine Heel Slide with Strap - 3 x daily - 7 x weekly - 2 sets - 10 reps - 10-15 hold  Prone Quadriceps Stretch with Strap - 3 x daily - 7 x weekly - 1 sets - 10 reps - 30 hold  Prone Knee Flexion AAROM - 3 x daily - 7 x weekly - 1 sets - 10 reps - 30 hold  Seated Knee Extension AAROM - 3 x daily - 7 x weekly - 2 sets - 10 reps - 30 hold  Supine Quad Set - 5 x daily - 7 x weekly - 2 sets - 10 reps - 5 hold  Supine Knee Extension Strengthening - 3 x daily - 7 x weekly - 2 sets - 10 reps - 30 hold  Small Range Straight Leg Raise - 3 x daily -  7 x weekly - 2 sets - 10 reps - 30 hold             Patient will benefit from skilled therapeutic intervention in order to improve the following deficits and impairments:  Abnormal gait, Decreased activity tolerance, Decreased endurance, Decreased knowledge of use of DME, Decreased range of motion, Decreased skin integrity, Decreased strength, Increased fascial restricitons, Pain, Postural dysfunction, Decreased  mobility, Difficulty walking, Increased edema  Visit Diagnosis: No diagnosis found.     Problem List There are no problems to display for this patient.   [name], PT 11/01/2021, 1:23 PM  [facility] [address] [place], [place], [address] Phone: [phone]   Fax:  [phone]  Name: JAIDA BASURTO MRN: [id number] Date of Birth: [birth year]   Karie Mainland, PT 11/01/21 1:24 PM Phone: [phone] Fax: [phone]

## 2021-11-01 NOTE — Patient Instructions (Signed)
[facility]  Access Code: [facility]  URL: [email or web address]  Date: 11/01/2021  Prepared by: Karie Mainland    Exercises  Supine Heel Slide with Strap - 3 x daily - 7 x weekly - 2 sets - 10 reps - 10-15 hold  Prone Quadriceps Stretch with Strap - 3 x daily - 7 x weekly - 1 sets - 10 reps - 30 hold  Prone Knee Flexion AAROM - 3 x daily - 7 x weekly - 1 sets - 10 reps - 30 hold  Seated Knee Extension AAROM - 3 x daily - 7 x weekly - 2 sets - 10 reps - 30 hold  Supine Quad Set - 5 x daily - 7 x weekly - 2 sets - 10 reps - 5 hold  Supine Knee Extension Strengthening - 3 x daily - 7 x weekly - 2 sets - 10 reps - 30 hold  Small Range Straight Leg Raise - 3 x daily - 7 x weekly - 2 sets - 10 reps - 30 hold

## 2021-11-06 ENCOUNTER — Encounter: Payer: Self-pay | Admitting: Rehabilitative and Restorative Service Providers"

## 2021-11-06 ENCOUNTER — Other Ambulatory Visit: Payer: Self-pay

## 2021-11-06 ENCOUNTER — Ambulatory Visit: Payer: Medicaid Other | Admitting: Rehabilitative and Restorative Service Providers"

## 2021-11-06 ENCOUNTER — Ambulatory Visit: Payer: Medicaid Other | Admitting: Physical Therapy

## 2021-11-06 DIAGNOSIS — S82034A Nondisplaced transverse fracture of right patella, initial encounter for closed fracture: Secondary | ICD-10-CM

## 2021-11-06 DIAGNOSIS — R600 Localized edema: Secondary | ICD-10-CM

## 2021-11-06 DIAGNOSIS — Z9889 Other specified postprocedural states: Secondary | ICD-10-CM | POA: Diagnosis not present

## 2021-11-06 DIAGNOSIS — M25661 Stiffness of right knee, not elsewhere classified: Secondary | ICD-10-CM

## 2021-11-06 DIAGNOSIS — R262 Difficulty in walking, not elsewhere classified: Secondary | ICD-10-CM

## 2021-11-06 DIAGNOSIS — M6281 Muscle weakness (generalized): Secondary | ICD-10-CM

## 2021-11-06 NOTE — Patient Instructions (Signed)
Told pt to only do exercises she has handouts for and not to add on any new exercises.

## 2021-11-06 NOTE — Therapy (Signed)
[facility] [address] [place], [place], [address] Phone: [phone]   Fax:  [phone]  Physical Therapy Treatment  Patient Details  Name: Nichole Allen MRN: [id number] Date of Birth: 1976/09/10 Referring Provider (PT): Dr. Ramond Marrow   Encounter Date: 11/06/2021   PT End of Session - 11/06/21 1233     Visit Number 9    Number of Visits 30    Date for PT Re-Evaluation 11/19/21    Authorization Type [employer]- no auth for first 12 visits    PT Start Time 1230    PT Stop Time 1301    PT Time Calculation (min) 31 min    Activity Tolerance Patient tolerated treatment well;No increased pain    Behavior During Therapy WFL for tasks assessed/performed             Past Medical History:  Diagnosis Date   Allergy    Anxiety    Depression     Past Surgical History:  Procedure Laterality Date   CESAREAN SECTION     NASAL SEPTUM SURGERY     ORIF PATELLA Right 09/19/2021   Procedure: OPEN REDUCTION INTERNAL (ORIF) FIXATION PATELLA;  Surgeon: Bjorn Pippin, MD;  Location: [facility];  Service: Orthopedics;  Laterality: Right;    There were no vitals filed for this visit.   Subjective Assessment - 11/06/21 1231     Subjective Pt 15 min late. using 2 crutches. no pain.    Currently in Pain? No/denies    Pain Score 0-No pain                               [facility] Adult PT Treatment/Exercise - 11/06/21 0001       Exercises   Exercises Knee/Hip      Knee/Hip Exercises: Standing   Other Standing Knee Exercises L: standing at counter: L hip abdct/hip ext/knee flex x 15;    Other Standing Knee Exercises R: hip abdct/hip ext/hip circles CW/CCW x 20 each. squats x 15 at counter. Limited AROM lunge R leg in front x 15. standing double heel raise x 20/toe raise x 20 bil      Knee/Hip Exercises: Seated   Other Seated Knee/Hip Exercises hip flex/hip abdct/addct combo x 10; iso LAQ  with ankle pump x 10; STS from EOM x 15 with concentration on control. Brace locked in 0-60 degrees for all                       PT Short Term Goals - 11/06/21 1310       PT SHORT TERM GOAL #1   Title Pt will be I with HEP for Rt LE ROM and strength and allowed by protocol    Status On-going               PT Long Term Goals - 09/24/21 1643       PT LONG TERM GOAL #1   Title Pt will be I with final HEP upon discharge for long term strength    Baseline unknown    Time 8    Period Weeks    Status New    Target Date 11/20/21      PT LONG TERM GOAL #2   Title Pt will be able to show Rt knee extension to at least 4/5 for maximal gait stability and transfers.    Baseline  unable to to test    Time 8    Period Weeks    Status New    Target Date 11/20/21      PT LONG TERM GOAL #3   Title Pt will demo Rt knee AROM 0- 120 deg for full ability to transfer and show optimal functional mobility    Baseline limited by protocol    Time 8    Period Weeks    Status New    Target Date 11/20/21      PT LONG TERM GOAL #4   Title Pt will be able to walk in the community with LRAD and min limp, pain < 3/10 for short errands, distances.    Baseline has not left her home before today since surgery    Time 8    Period Weeks    Status New    Target Date 11/20/21      PT LONG TERM GOAL #5   Title Pt will be able to negotiate 6 or more stairs with 1 rail or AD reciprocally with good technique and knee control.    Baseline does a very small step with crutch into her home    Time 8    Period Weeks    Status New    Target Date 11/20/21                   Plan - 11/06/21 1309     Clinical Impression Statement Pt was able to progress exercises today without increased pain following protocol. Pt would continue to benefit from PT for LE strengthening and ROM per protocol. Brace was locked in 0-60 degrees for all.    Examination-Activity Limitations  Bathing;Carry;Lift;Stand;Sleep;Locomotion Level;Bend;Dressing;Reach Overhead;Squat;Caring for Others;Hygiene/Grooming;Stairs;Transfers;Toileting    Rehab Potential Excellent    PT Frequency 2x / week    PT Duration 6 weeks    PT Treatment/Interventions ADLs/Self Care Home Management;Stair training;Therapeutic exercise;Passive range of motion;Manual techniques;Neuromuscular re-education;Therapeutic activities;Electrical Stimulation;Functional mobility training;Balance training;Patient/family education;Scar mobilization;Gait training;DME Instruction;Cryotherapy    PT Next Visit Plan [phone]: upgrade HEP 0-90 deg Rt knee locked to 30 def with full WB.  Cont prone knee flexion , gait, quad and HS set.  Review and progress per protocol.    Consulted and Agree with Plan of Care Patient;Family member/caregiver    Family Member Consulted young lady (daughter?)             Patient will benefit from skilled therapeutic intervention in order to improve the following deficits and impairments:  Abnormal gait, Decreased activity tolerance, Decreased endurance, Decreased knowledge of use of DME, Decreased range of motion, Decreased skin integrity, Decreased strength, Increased fascial restricitons, Pain, Postural dysfunction, Decreased mobility, Difficulty walking, Increased edema  Visit Diagnosis: S/P ORIF (open reduction internal fixation) fracture  Nondisplaced transverse fracture of right patella, initial encounter for closed fracture  Difficulty in walking, not elsewhere classified  Stiffness of right knee, not elsewhere classified  Muscle weakness (generalized)  Localized edema     Problem List There are no problems to display for this patient.   Luna Fuse, PT 11/06/2021, 1:13 PM  [facility] [address] [place], [place], [address] Phone: [phone]   Fax:  [phone]  Name: Nichole Allen MRN: [id number] Date of Birth:  04-03-1976

## 2021-11-07 ENCOUNTER — Ambulatory Visit: Payer: Medicaid Other

## 2021-11-07 DIAGNOSIS — S82034A Nondisplaced transverse fracture of right patella, initial encounter for closed fracture: Secondary | ICD-10-CM

## 2021-11-07 DIAGNOSIS — Z8781 Personal history of (healed) traumatic fracture: Secondary | ICD-10-CM

## 2021-11-07 DIAGNOSIS — M25661 Stiffness of right knee, not elsewhere classified: Secondary | ICD-10-CM

## 2021-11-07 DIAGNOSIS — Z9889 Other specified postprocedural states: Secondary | ICD-10-CM | POA: Diagnosis not present

## 2021-11-07 DIAGNOSIS — R262 Difficulty in walking, not elsewhere classified: Secondary | ICD-10-CM

## 2021-11-07 NOTE — Therapy (Signed)
[facility]patient [facility] [address] [place], [place], [address] Phone: [phone]   Fax:  [phone]  Physical Therapy Treatment/Progress Report  Patient Details  Name: Nichole Allen MRN: [id number] Date of Birth: 1976-05-18 Referring Provider (PT): Dr. Ramond Marrow   Encounter Date: 11/07/2021  10th Physical Therapy Progress Note   Dates of Reporting Period:09/24/21-11/07/21  See Note below for Objective Data and Assessment of Progress/Goals.     PT End of Session - 11/07/21 1820     Visit Number 10    Number of Visits 30    Date for PT Re-Evaluation 11/19/21    Authorization Type [employer]- no auth for first 12 visits    Authorization - Number of Visits 12    PT Start Time 1745    PT Stop Time 1835    PT Time Calculation (min) 50 min    Activity Tolerance Patient tolerated treatment well;No increased pain    Behavior During Therapy WFL for tasks assessed/performed             Past Medical History:  Diagnosis Date   Allergy    Anxiety    Depression     Past Surgical History:  Procedure Laterality Date   CESAREAN SECTION     NASAL SEPTUM SURGERY     ORIF PATELLA Right 09/19/2021   Procedure: OPEN REDUCTION INTERNAL (ORIF) FIXATION PATELLA;  Surgeon: Bjorn Pippin, MD;  Location: [facility];  Service: Orthopedics;  Laterality: Right;    There were no vitals filed for this visit.   Subjective Assessment - 11/07/21 1817     Subjective Today is her first therapy session following a full work day.    Limitations Sitting;Lifting;House hold activities;Standing;Other (comment)              Therapeutic Exercise: NuStep L5 UE and LE for 6 min SPM goal of 40 Hamstring curl light yellow band 2 x15 Supine AAROM flexion to 86 deg.  15x2 with green strap SAQ 2 x 15 SLR 2 x 15 Sidelie abduction 2x15 Sit to stand x 10  Supine hip abduction x 15 each LE Calf raises x 15 from slant  board Patellar mobs and mobs into flexion to regain knee flexion Gait training with focus on heel strike and increased cadence     PT Short Term Goals - 11/07/21 1852       PT SHORT TERM GOAL #1   Title Pt will be I with HEP for Rt LE ROM and strength and allowed by protocol    Baseline 11/07/21 reports compliance, reviewed gait pattern as HEP    Time 4    Period Weeks    Status On-going    Target Date 10/22/21      PT SHORT TERM GOAL #2   Title Pt will be able to flex Rt knee to > 50 deg for progression of protocol and transfers    Baseline 11/07/21 86d flexion following AAROM heele slides    Time 4    Period Weeks    Status Achieved               PT Long Term Goals - 11/07/21 1854       PT LONG TERM GOAL #1   Title Pt will be I with final HEP upon discharge for long term strength    Baseline unknown    Time 8    Period Weeks    Status On-going    Target  Date 11/20/21      PT LONG TERM GOAL #2   Title Pt will be able to show Rt knee extension to at least 4/5 for maximal gait stability and transfers.    Baseline unable to to test, 11/07/21 3+/5 knee extension sterngth    Time 8    Period Weeks    Status On-going    Target Date 11/20/21      PT LONG TERM GOAL #3   Title Pt will demo Rt knee AROM 0- 120 deg for full ability to transfer and show optimal functional mobility    Baseline limited by protocol; 11/07/21 0-86d PROM    Time 8    Period Weeks    Status On-going    Target Date 11/20/21      PT LONG TERM GOAL #4   Title Pt will be able to walk in the community with LRAD and min limp, pain < 3/10 for short errands, distances.    Baseline has not left her home before today since surgery; 11/07/21 Reviewed heel strike and cadence today    Time 8    Period Weeks    Status New    Target Date 11/20/21      PT LONG TERM GOAL #5   Title Pt will be able to negotiate 6 or more stairs with 1 rail or AD reciprocally with good technique and knee control.     Baseline does a very small step with crutch into her home; 11/07/21 not testd    Time 8    Period Weeks    Status On-going    Target Date 11/20/21                   Plan - 11/07/21 1842     Clinical Impression Statement Able to tolerate increased reps w/o pain, reviewed exercises for form, added adductor squeeze for LAQs, began abductor strengthening, gait training focused on heel strike and allowing knee flexion in swing phase.  Progressing well through protocol, knee fexion improving.  Patient compliant with brace wear and HEP    Examination-Activity Limitations Bathing;Carry;Lift;Stand;Sleep;Locomotion Level;Bend;Dressing;Reach Overhead;Squat;Caring for Others;Hygiene/Grooming;Stairs;Transfers;Toileting    Rehab Potential Excellent    PT Frequency 2x / week    PT Duration 6 weeks    PT Treatment/Interventions ADLs/Self Care Home Management;Stair training;Therapeutic exercise;Passive range of motion;Manual techniques;Neuromuscular re-education;Therapeutic activities;Electrical Stimulation;Functional mobility training;Balance training;Patient/family education;Scar mobilization;Gait training;DME Instruction;Cryotherapy    PT Next Visit Plan focus on flexion and gait, patellar mobs and quad control, refer to prorocol    Consulted and Agree with Plan of Care Patient;Family member/caregiver    Family Member Consulted young lady (daughter?)             Patient will benefit from skilled therapeutic intervention in order to improve the following deficits and impairments:  Abnormal gait, Decreased activity tolerance, Decreased endurance, Decreased knowledge of use of DME, Decreased range of motion, Decreased skin integrity, Decreased strength, Increased fascial restricitons, Pain, Postural dysfunction, Decreased mobility, Difficulty walking, Increased edema  Visit Diagnosis: S/P ORIF (open reduction internal fixation) fracture  Nondisplaced transverse fracture of right patella,  initial encounter for closed fracture  Stiffness of right knee, not elsewhere classified  Difficulty in walking, not elsewhere classified     Problem List There are no problems to display for this patient.   Hildred Laser, PT 11/07/2021, 7:03 PM  [facility] [address] [place], [place], [address] Phone: [phone]   Fax:  [phone]  Name: Nichole Allen MRN: [id number] Date of Birth: 1976-10-15

## 2021-11-08 ENCOUNTER — Encounter: Payer: Medicaid Other | Admitting: Physical Therapy

## 2021-11-14 ENCOUNTER — Encounter: Payer: Self-pay | Admitting: Physical Therapy

## 2021-11-14 ENCOUNTER — Other Ambulatory Visit: Payer: Self-pay

## 2021-11-14 ENCOUNTER — Ambulatory Visit: Payer: Medicaid Other | Admitting: Physical Therapy

## 2021-11-14 DIAGNOSIS — M25661 Stiffness of right knee, not elsewhere classified: Secondary | ICD-10-CM

## 2021-11-14 DIAGNOSIS — Z9889 Other specified postprocedural states: Secondary | ICD-10-CM | POA: Diagnosis not present

## 2021-11-14 DIAGNOSIS — S82034A Nondisplaced transverse fracture of right patella, initial encounter for closed fracture: Secondary | ICD-10-CM

## 2021-11-14 NOTE — Therapy (Signed)
[facility] [address] [place], [place], [address] Phone: [phone]   Fax:  [phone]  Physical Therapy Treatment  Patient Details  Name: Nichole Allen MRN: [id number] Date of Birth: 06/15/1976 Referring Provider (PT): Dr. Ramond Marrow   Encounter Date: 11/14/2021   PT End of Session - 11/14/21 1340     Visit Number 11    Number of Visits 30    Date for PT Re-Evaluation 11/19/21    Authorization Type [employer]- no auth for first 12 visits    Authorization - Number of Visits 12    PT Start Time 1330   15 min late   PT Stop Time 1400    PT Time Calculation (min) 30 min             Past Medical History:  Diagnosis Date   Allergy    Anxiety    Depression     Past Surgical History:  Procedure Laterality Date   CESAREAN SECTION     NASAL SEPTUM SURGERY     ORIF PATELLA Right 09/19/2021   Procedure: OPEN REDUCTION INTERNAL (ORIF) FIXATION PATELLA;  Surgeon: Bjorn Pippin, MD;  Location: [facility];  Service: Orthopedics;  Laterality: Right;    There were no vitals filed for this visit.   Subjective Assessment - 11/14/21 1335     Subjective No pain, the brace falls down.    Currently in Pain? No/denies            Therapeutic Exercise: Rec bike partial to ful revolutions- removed brace once on bike Gait with single crutch - focus on heel strike and knee flexion  (brace open from 0-90)  Quad set x 10 SLR 2 x 15- focus on initial quad set  SLR with ER x 15 Heel slide x 10 Prone hamstring curls x 20      PT Short Term Goals - 11/07/21 1852       PT SHORT TERM GOAL #1   Title Pt will be I with HEP for Rt LE ROM and strength and allowed by protocol    Baseline 11/07/21 reports compliance, reviewed gait pattern as HEP    Time 4    Period Weeks    Status On-going    Target Date 10/22/21      PT SHORT TERM GOAL #2   Title Pt will be able to flex Rt knee to > 50 deg for  progression of protocol and transfers    Baseline 11/07/21 86d flexion following AAROM heele slides    Time 4    Period Weeks    Status Achieved               PT Long Term Goals - 11/07/21 1854       PT LONG TERM GOAL #1   Title Pt will be I with final HEP upon discharge for long term strength    Baseline unknown    Time 8    Period Weeks    Status On-going    Target Date 11/20/21      PT LONG TERM GOAL #2   Title Pt will be able to show Rt knee extension to at least 4/5 for maximal gait stability and transfers.    Baseline unable to to test, 11/07/21 3+/5 knee extension sterngth    Time 8    Period Weeks    Status On-going    Target Date 11/20/21      PT  LONG TERM GOAL #3   Title Pt will demo Rt knee AROM 0- 120 deg for full ability to transfer and show optimal functional mobility    Baseline limited by protocol; 11/07/21 0-86d PROM    Time 8    Period Weeks    Status On-going    Target Date 11/20/21      PT LONG TERM GOAL #4   Title Pt will be able to walk in the community with LRAD and min limp, pain < 3/10 for short errands, distances.    Baseline has not left her home before today since surgery; 11/07/21 Reviewed heel strike and cadence today    Time 8    Period Weeks    Status New    Target Date 11/20/21      PT LONG TERM GOAL #5   Title Pt will be able to negotiate 6 or more stairs with 1 rail or AD reciprocally with good technique and knee control.    Baseline does a very small step with crutch into her home; 11/07/21 not testd    Time 8    Period Weeks    Status On-going    Target Date 11/20/21                   Plan - 11/14/21 1416     Clinical Impression Statement Nichole Allen was late today. 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 She did have increased pain before holiday with too much time on her feet. She uses a stool as able. Opened ROM parameters 0-90 in hinged brace. Worked on Animator and able to complete full  revolutions with compensations on rec bike. Completed quad activation exercises in limited time remaining in session. Encouraged HEP.    PT Treatment/Interventions ADLs/Self Care Home Management;Stair training;Therapeutic exercise;Passive range of motion;Manual techniques;Neuromuscular re-education;Therapeutic activities;Electrical Stimulation;Functional mobility training;Balance training;Patient/family education;Scar mobilization;Gait training;DME Instruction;Cryotherapy    PT Next Visit Plan focus on flexion ROM and gait, patellar mobs and quad control, refer to prorocol    PT Home Exercise Plan Access Code: [email or web address] Code: [facility]  URL: [email or web address]  Date: 11/01/2021  Prepared by: Karie Mainland    Exercises  Supine Heel Slide with Strap - 3 x daily - 7 x weekly - 2 sets - 10 reps - 10-15 hold  Prone Quadriceps Stretch with Strap - 3 x daily - 7 x weekly - 1 sets - 10 reps - 30 hold  Prone Knee Flexion AAROM - 3 x daily - 7 x weekly - 1 sets - 10 reps - 30 hold  Seated Knee Extension AAROM - 3 x daily - 7 x weekly - 2 sets - 10 reps - 30 hold  Supine Quad Set - 5 x daily - 7 x weekly - 2 sets - 10 reps - 5 hold  Supine Knee Extension Strengthening - 3 x daily - 7 x weekly - 2 sets - 10 reps - 30 hold  Small Range Straight Leg Raise - 3 x daily - 7 x weekly - 2 sets - 10 reps - 30 hold    Consulted and Agree with Plan of Care Patient;Family member/caregiver             Patient will benefit from skilled therapeutic intervention in order to improve the following deficits and impairments:  Abnormal gait, Decreased activity tolerance, Decreased endurance, Decreased knowledge of use of DME, Decreased range of motion, Decreased skin integrity, Decreased strength, Increased fascial restricitons,  Pain, Postural dysfunction, Decreased mobility, Difficulty walking, Increased edema  Visit Diagnosis: S/P ORIF (open reduction internal fixation) fracture  Nondisplaced transverse  fracture of right patella, initial encounter for closed fracture  Stiffness of right knee, not elsewhere classified     Problem List There are no problems to display for this patient.   Sherrie Mustache, [place] 11/14/2021, 2:21 PM  [facility] [address] [place], [place], [address] Phone: [phone]   Fax:  [phone]  Name: Nichole Allen MRN: [id number] Date of Birth: 03-May-1976

## 2021-11-15 ENCOUNTER — Ambulatory Visit: Payer: Medicaid Other | Admitting: Physical Therapy

## 2021-11-16 ENCOUNTER — Ambulatory Visit: Payer: Medicaid Other

## 2021-11-16 ENCOUNTER — Other Ambulatory Visit: Payer: Self-pay

## 2021-11-16 DIAGNOSIS — Z9889 Other specified postprocedural states: Secondary | ICD-10-CM | POA: Diagnosis not present

## 2021-11-16 DIAGNOSIS — S82034A Nondisplaced transverse fracture of right patella, initial encounter for closed fracture: Secondary | ICD-10-CM

## 2021-11-16 DIAGNOSIS — M6281 Muscle weakness (generalized): Secondary | ICD-10-CM

## 2021-11-16 DIAGNOSIS — R262 Difficulty in walking, not elsewhere classified: Secondary | ICD-10-CM

## 2021-11-16 DIAGNOSIS — Z8781 Personal history of (healed) traumatic fracture: Secondary | ICD-10-CM

## 2021-11-16 DIAGNOSIS — M25661 Stiffness of right knee, not elsewhere classified: Secondary | ICD-10-CM

## 2021-11-16 NOTE — Therapy (Signed)
[facility]patient [facility] [address] [place], [place], [address] Phone: [phone]   Fax:  [phone]  Physical Therapy Treatment/Recert/Reauth  Patient Details  Name: Nichole Allen MRN: [id number] Date of Birth: 14-Sep-1976 Referring Provider (PT): Dr. Ramond Marrow   Encounter Date: 11/16/2021   PT End of Session - 11/16/21 1335     Visit Number 12    Number of Visits 28    Date for PT Re-Evaluation 01/11/22    Authorization Type [employer]- no auth for first 12 visits    Authorization - Number of Visits 12    PT Start Time 1106    PT Stop Time 1148    PT Time Calculation (min) 42 min    Equipment Utilized During Treatment Other (comment)   knee bledsoe brace and crutch   Activity Tolerance Patient tolerated treatment well;No increased pain    Behavior During Therapy WFL for tasks assessed/performed             Past Medical History:  Diagnosis Date   Allergy    Anxiety    Depression     Past Surgical History:  Procedure Laterality Date   CESAREAN SECTION     NASAL SEPTUM SURGERY     ORIF PATELLA Right 09/19/2021   Procedure: OPEN REDUCTION INTERNAL (ORIF) FIXATION PATELLA;  Surgeon: Bjorn Pippin, MD;  Location:  [facility];  Service: Orthopedics;  Laterality: Right;    There were no vitals filed for this visit.   Subjective Assessment - 11/16/21 1334     Subjective Pt reports she is not having R knee pain today    Patient Stated Goals Patient wants to be independent again    Currently in Pain? No/denies    Pain Location Knee    Pain Orientation Right                [facility] PT Assessment - 11/16/21 0001       PROM   Overall PROM Comments AAROM right knee flexion 87      Strength   Strength Assessment Site Hip;Knee    Right/Left Hip Right    Right Hip Flexion 3+/5    Right Hip Extension 3+/5    Right Hip External Rotation  3+/5    Right Hip Internal Rotation 4-/5    Right Hip  ABduction 3+/5    Right Hip ADduction 4-/5    Right/Left Knee Right    Right Knee Flexion 3+/5    Right Knee Extension 3+/5                           Therapeutic Exercise: Rec bike partial to ful revolutions- removed brace once on bike, 5 min L1 Quad set x 10, 5' SLR 2 x 15 SL Hip abd 2 x15 SL Clam 2,15, RTB Bridging 2x15 Heel slide x 10  Manual: Passive stretching for knee flexion ROM          PT Education - 11/16/21 1415     Education Details Upgraded HEP    Person(s) Educated Patient    Methods Explanation;Demonstration;Tactile cues;Verbal cues;Handout    Comprehension Verbalized understanding;Returned demonstration;Verbal cues required;Tactile cues required              PT Short Term Goals - 11/07/21 1852       PT SHORT TERM GOAL #1   Title Pt will be I with HEP for Rt LE ROM and strength and  allowed by protocol    Baseline 11/07/21 reports compliance, reviewed gait pattern as HEP    Time 4    Period Weeks    Status On-going    Target Date 10/22/21      PT SHORT TERM GOAL #2   Title Pt will be able to flex Rt knee to > 50 deg for progression of protocol and transfers    Baseline 11/07/21 86d flexion following AAROM heele slides    Time 4    Period Weeks    Status Achieved               PT Long Term Goals - 11/16/21 1350       PT LONG TERM GOAL #1   Title Pt will be I with final HEP upon discharge for long term strength. 11/16/21: progression in HEP are added as indicated    Status On-going    Target Date 01/11/22      PT LONG TERM GOAL #2   Title Pt will be able to show Rt knee extension to at least 4/5 for maximal gait stability and transfers. 11/16/21: R knee and hip strength = 3+/5, pt able to complete SLR s ext lag.    Baseline unable to to test, 11/07/21 3+/5 knee extension sterngth    Status On-going    Target Date 01/11/22      PT LONG TERM GOAL #3   Title Pt will demo Rt knee AROM 0- 120 deg for full ability  to transfer and show optimal functional mobility. 11/14/21: Knee flexion =87d c hard endfeel    Baseline limited by protocol; 11/07/21 0-86d PROM    Status On-going    Target Date 01/11/22      PT LONG TERM GOAL #4   Title Pt will be able to walk in the community with LRAD and min limp, pain < 3/10 for short errands, distances.: Pt is current walking c bledsoe brace opened to 90d. pt continues in Bledsoe brace per protocol    Baseline has not left her home before today since surgery; 11/07/21 Reviewed heel strike and cadence today    Status On-going    Target Date 01/11/22      PT LONG TERM GOAL #5   Title Pt will be able to negotiate 6 or more stairs with 1 rail or AD reciprocally with good technique and knee control.    Baseline does a very small step with crutch into her home; 11/07/21 not testd. 12/230/22; Not tested    Status On-going    Target Date 01/11/22                   Plan - 11/16/21 1339     Clinical Impression Statement PT was completed to address R knee ROM and strength as per her post patellar fracture protocol. Pt is making appropriate progress as per protocol. Pt is able to complete a R SLR s knee ext lag and pt is walking with her bledsoe brace ROM increased to 90d. With assessmnet of R knee flexion ROM endfeel, the endfeel was hard in nature indicating progressing toward 120d may proceed slowly. Pt will benefit from skilled PT to adress ROM and strength deficits as per protocal to optimize pt's functional use of her R LE with minimized pain.    Examination-Activity Limitations Bathing;Carry;Lift;Stand;Sleep;Locomotion Level;Bend;Dressing;Reach Overhead;Squat;Caring for Others;Hygiene/Grooming;Stairs;Transfers;Toileting    Examination-Participation Restrictions Community Activity;Laundry;Occupation;Shop;Driving;Cleaning;Interpersonal Relationship;Meal Prep    Stability/Clinical Decision Making Stable/Uncomplicated    Clinical Decision Making Low  Rehab  Potential Excellent    PT Frequency 2x / week    PT Duration 8 weeks    PT Treatment/Interventions ADLs/Self Care Home Management;Stair training;Therapeutic exercise;Passive range of motion;Manual techniques;Neuromuscular re-education;Therapeutic activities;Electrical Stimulation;Functional mobility training;Balance training;Patient/family education;Scar mobilization;Gait training;DME Instruction;Cryotherapy    PT Next Visit Plan focus on flexion ROM and gait, patellar mobs and quad control, refer to prorocol    PT Home Exercise Plan Access Code: [email or web address] Code: [facility]  URL: [email or web address]  Date: 11/01/2021  Prepared by: Karie Mainland    Exercises  Supine Heel Slide with Strap - 3 x daily - 7 x weekly - 2 sets - 10 reps - 10-15 hold  Prone Quadriceps Stretch with Strap - 3 x daily - 7 x weekly - 1 sets - 10 reps - 30 hold  Prone Knee Flexion AAROM - 3 x daily - 7 x weekly - 1 sets - 10 reps - 30 hold  Seated Knee Extension AAROM - 3 x daily - 7 x weekly - 2 sets - 10 reps - 30 hold  Supine Quad Set - 5 x daily - 7 x weekly - 2 sets - 10 reps - 5 hold  Supine Knee Extension Strengthening - 3 x daily - 7 x weekly - 2 sets - 10 reps - 30 hold  Small Range Straight Leg Raise - 3 x daily - 7 x weekly - 2 sets - 10 reps - 30 hold    Consulted and Agree with Plan of Care Patient             Patient will benefit from skilled therapeutic intervention in order to improve the following deficits and impairments:  Abnormal gait, Decreased activity tolerance, Decreased endurance, Decreased knowledge of use of DME, Decreased range of motion, Decreased skin integrity, Decreased strength, Increased fascial restricitons, Pain, Postural dysfunction, Decreased mobility, Difficulty walking, Increased edema  Visit Diagnosis: S/P ORIF (open reduction internal fixation) fracture  Nondisplaced transverse fracture of right patella, initial encounter for closed fracture  Difficulty in walking,  not elsewhere classified  Stiffness of right knee, not elsewhere classified  Muscle weakness (generalized)     Problem List There are no problems to display for this patient.   Joellyn Rued MS, PT 11/16/21 2:17 PM   [facility] [address] [place], [place], [address] Phone: [phone]   Fax:  [phone]  Name: RAYANNE PADMANABHAN MRN: [id number] Date of Birth: December 30, 1975  Check all possible CPT codes: 97110- Therapeutic Exercise, [phone]- Neuro Re-education, [phone] - Gait Training, [phone] - Manual Therapy, [phone] - Therapeutic Activities, [phone] - Self Care, [phone] - Electrical stimulation (unattended), [id number] - Electrical stimulation (Manual), [id number] - Iontophoresis, [id number] - Ultrasound, [id number] - Vaso, and [id number] - Physical performance training

## 2021-11-16 NOTE — Patient Instructions (Signed)
Access Code: [facility] URL: [email or web address] Date: 11/16/2021 Prepared by: Joellyn Rued  Exercises Supine Heel Slide with Strap - 3 x daily - 7 x weekly - 2 sets - 10 reps - 10-15 hold Prone Quadriceps Stretch with Strap - 3 x daily - 7 x weekly - 1 sets - 10 reps - 30 hold Prone Knee Flexion AAROM - 3 x daily - 7 x weekly - 1 sets - 10 reps - 30 hold Seated Knee Extension AAROM - 3 x daily - 7 x weekly - 2 sets - 10 reps - 30 hold Supine Quad Set - 5 x daily - 7 x weekly - 2 sets - 10 reps - 5 hold Supine Knee Extension Strengthening - 3 x daily - 7 x weekly - 2 sets - 10 reps - 30 hold Small Range Straight Leg Raise - 3 x daily - 7 x weekly - 2 sets - 10 reps - 30 hold Sidelying Hip Abduction - 1 x daily - 7 x weekly - 2 sets - 10 reps - 3 hold Clamshell with Resistance - 1 x daily - 7 x weekly - 2 sets - 10 reps - 3 hold Supine Bridge - 1 x daily - 7 x weekly - 2 sets - 10 reps - 3 hold

## 2021-11-20 ENCOUNTER — Ambulatory Visit: Payer: Medicaid Other | Attending: Orthopaedic Surgery | Admitting: Physical Therapy

## 2021-11-20 ENCOUNTER — Other Ambulatory Visit: Payer: Self-pay

## 2021-11-20 DIAGNOSIS — S82034A Nondisplaced transverse fracture of right patella, initial encounter for closed fracture: Secondary | ICD-10-CM | POA: Insufficient documentation

## 2021-11-20 DIAGNOSIS — R600 Localized edema: Secondary | ICD-10-CM | POA: Diagnosis present

## 2021-11-20 DIAGNOSIS — M25661 Stiffness of right knee, not elsewhere classified: Secondary | ICD-10-CM | POA: Diagnosis present

## 2021-11-20 DIAGNOSIS — Z9889 Other specified postprocedural states: Secondary | ICD-10-CM | POA: Diagnosis not present

## 2021-11-20 DIAGNOSIS — R262 Difficulty in walking, not elsewhere classified: Secondary | ICD-10-CM | POA: Insufficient documentation

## 2021-11-20 DIAGNOSIS — M6281 Muscle weakness (generalized): Secondary | ICD-10-CM | POA: Insufficient documentation

## 2021-11-20 DIAGNOSIS — Z8781 Personal history of (healed) traumatic fracture: Secondary | ICD-10-CM | POA: Insufficient documentation

## 2021-11-20 NOTE — Therapy (Addendum)
[facility] Outpatient Rehabilitation [facility] [address] [place], [place], [address] Phone: [phone]   Fax:  [phone]  Physical Therapy Treatment  Patient Details  Name: Nichole Allen MRN: [id number] Date of Birth: 27-Dec-1975 Referring Provider (PT): Dr. Ramond Marrow   Encounter Date: 11/20/2021   PT End of Session - 11/20/21 1413     Visit Number 13    Number of Visits 28    Date for PT Re-Evaluation 01/11/22    Authorization Type [employer]- no auth for first 12 visits- completed for re-auth on 11/16/21 -checking to see if new auth needed for new year    Authorization - Visit Number 1    Authorization - Number of Visits 12    PT Start Time 1403    PT Stop Time 1456    PT Time Calculation (min) 53 min             Past Medical History:  Diagnosis Date   Allergy    Anxiety    Depression     Past Surgical History:  Procedure Laterality Date   CESAREAN SECTION     NASAL SEPTUM SURGERY     ORIF PATELLA Right 09/19/2021   Procedure: OPEN REDUCTION INTERNAL (ORIF) FIXATION PATELLA;  Surgeon: Bjorn Pippin, MD;  Location: [facility];  Service: Orthopedics;  Laterality: Right;    There were no vitals filed for this visit.   Subjective Assessment - 11/20/21 1407     Subjective I did not work today. I do not have pain right now.    Currently in Pain? No/denies             Therapeutic Exercise: Nustep L3 LE only x 5 min  Rec bike ful revolutions- removed brace once on bike x 5 minutes  Gait with single crutch - focus on heel strike and knee flexion  (brace open from 0-90)  Passive knee flexion 5 x 30 sec  Quad set x 10 SLR 2 x 15- focus on initial quad set  SAQ over small ball x 20  SLR with ER x 15 Heel slide x 10 Prone hamstring curls x 20  Prone right hip extension with knee flexion LAQ x 20  Seated scoot stretch for knee flexion 3 x 30 sec  Hamstring curls red band x 25 Seated hamstring stretch 2 x 30  sec Modalities: ice pack right knee x 15 minutes     [facility] PT Assessment - 11/20/21 0001       PROM   Overall PROM Comments 86 degrees knee flexion improved to 92                      PT Short Term Goals - 11/07/21 1852       PT SHORT TERM GOAL #1   Title Pt will be I with HEP for Rt LE ROM and strength and allowed by protocol    Baseline 11/07/21 reports compliance, reviewed gait pattern as HEP    Time 4    Period Weeks    Status On-going    Target Date 10/22/21      PT SHORT TERM GOAL #2   Title Pt will be able to flex Rt knee to > 50 deg for progression of protocol and transfers    Baseline 11/07/21 86d flexion following AAROM heele slides    Time 4    Period Weeks    Status Achieved  PT Long Term Goals - 11/16/21 1350       PT LONG TERM GOAL #1   Title Pt will be I with final HEP upon discharge for long term strength. 11/16/21: progression in HEP are added as indicated    Status On-going    Target Date 01/11/22      PT LONG TERM GOAL #2   Title Pt will be able to show Rt knee extension to at least 4/5 for maximal gait stability and transfers. 11/16/21: R knee and hip strength = 3+/5, pt able to complete SLR s ext lag.    Baseline unable to to test, 11/07/21 3+/5 knee extension sterngth    Status On-going    Target Date 01/11/22      PT LONG TERM GOAL #3   Title Pt will demo Rt knee AROM 0- 120 deg for full ability to transfer and show optimal functional mobility. 11/14/21: Knee flexion =87d c hard endfeel    Baseline limited by protocol; 11/07/21 0-86d PROM    Status On-going    Target Date 01/11/22      PT LONG TERM GOAL #4   Title Pt will be able to walk in the community with LRAD and min limp, pain < 3/10 for short errands, distances.: Pt is current walking c bledsoe brace opened to 90d. pt continues in Bledsoe brace per protocol    Baseline has not left her home before today since surgery; 11/07/21 Reviewed heel strike and  cadence today    Status On-going    Target Date 01/11/22      PT LONG TERM GOAL #5   Title Pt will be able to negotiate 6 or more stairs with 1 rail or AD reciprocally with good technique and knee control.    Baseline does a very small step with crutch into her home; 11/07/21 not testd. 12/230/22; Not tested    Status On-going    Target Date 01/11/22                   Plan - 11/20/21 1417     Clinical Impression Statement Pt arrives with 86 degrees active knee flexion. Worked on nustep and then able to complete full forward revolutions on bike with less compensation. Continued with passive stretching , inreasing ROM to 92 degrees for flexion. Continued with quad and hip strengthening.    PT Treatment/Interventions ADLs/Self Care Home Management;Stair training;Therapeutic exercise;Passive range of motion;Manual techniques;Neuromuscular re-education;Therapeutic activities;Electrical Stimulation;Functional mobility training;Balance training;Patient/family education;Scar mobilization;Gait training;DME Instruction;Cryotherapy    PT Next Visit Plan focus on flexion ROM and gait, patellar mobs and quad control, refer to prorocol    PT Home Exercise Plan Access Code: [email or web address] Code: [email or web address]  URL: [email or web address]  Date: 11/01/2021  Prepared by: Karie MainlandJennifer Paa    Exercises  Supine Heel Slide with Strap - 3 x daily - 7 x weekly - 2 sets - 10 reps - 10-15 hold  Prone Quadriceps Stretch with Strap - 3 x daily - 7 x weekly - 1 sets - 10 reps - 30 hold  Prone Knee Flexion AAROM - 3 x daily - 7 x weekly - 1 sets - 10 reps - 30 hold  Seated Knee Extension AAROM - 3 x daily - 7 x weekly - 2 sets - 10 reps - 30 hold  Supine Quad Set - 5 x daily - 7 x weekly - 2 sets - 10 reps - 5 hold  Supine Knee Extension Strengthening - 3 x daily - 7  x weekly - 2 sets - 10 reps - 30 hold  Small Range Straight Leg Raise - 3 x daily - 7 x weekly - 2 sets - 10 reps - 30 hold    Consulted and Agree  with Plan of Care Patient    Family Member Consulted young lady (daughter?)             Patient will benefit from skilled therapeutic intervention in order to improve the following deficits and impairments:  Abnormal gait, Decreased activity tolerance, Decreased endurance, Decreased knowledge of use of DME, Decreased range of motion, Decreased skin integrity, Decreased strength, Increased fascial restricitons, Pain, Postural dysfunction, Decreased mobility, Difficulty walking, Increased edema  Visit Diagnosis: S/P ORIF (open reduction internal fixation) fracture  Nondisplaced transverse fracture of right patella, initial encounter for closed fracture     Problem List There are no problems to display for this patient.   Sherrie Mustache, [place] 11/20/2021, 2:54 PM  [facility] [address] [place], [place], [address] Phone: [phone]   Fax:  [phone]  Name: Nichole Allen MRN: [id number] Date of Birth: 08-09-1976

## 2021-11-22 ENCOUNTER — Encounter: Payer: Self-pay | Admitting: Physical Therapy

## 2021-11-22 ENCOUNTER — Ambulatory Visit: Payer: Medicaid Other | Admitting: Physical Therapy

## 2021-11-22 ENCOUNTER — Other Ambulatory Visit: Payer: Self-pay

## 2021-11-22 DIAGNOSIS — Z8781 Personal history of (healed) traumatic fracture: Secondary | ICD-10-CM

## 2021-11-22 DIAGNOSIS — Z9889 Other specified postprocedural states: Secondary | ICD-10-CM | POA: Diagnosis not present

## 2021-11-22 DIAGNOSIS — R262 Difficulty in walking, not elsewhere classified: Secondary | ICD-10-CM

## 2021-11-22 DIAGNOSIS — S82034A Nondisplaced transverse fracture of right patella, initial encounter for closed fracture: Secondary | ICD-10-CM

## 2021-11-22 NOTE — Therapy (Signed)
[facility] [address] [place], [place], [address] Phone: [phone]   Fax:  [phone]  Physical Therapy Treatment  Patient Details  Name: Nichole Allen MRN: [id number] Date of Birth: Oct 16, 1976 Referring Provider (PT): Dr. Ramond Marrowax Varkey   Encounter Date: 11/22/2021   PT End of Session - 11/22/21 1241     Visit Number 14    Number of Visits 28    Date for PT Re-Evaluation 01/11/22    Authorization Type [employer]- no auth for first 12 visits- completed for re-auth on 11/16/21 -checking to see if new auth needed for new year    Authorization - Visit Number 2    Authorization - Number of Visits 12    PT Start Time 1235    PT Stop Time 1330    PT Time Calculation (min) 55 min             Past Medical History:  Diagnosis Date   Allergy    Anxiety    Depression     Past Surgical History:  Procedure Laterality Date   CESAREAN SECTION     NASAL SEPTUM SURGERY     ORIF PATELLA Right 09/19/2021   Procedure: OPEN REDUCTION INTERNAL (ORIF) FIXATION PATELLA;  Surgeon: Bjorn PippinVarkey, Dax T, MD;  Location: [facility];  Service: Orthopedics;  Laterality: Right;    There were no vitals filed for this visit.   Subjective Assessment - 11/22/21 1240     Subjective I did not work today. I do not have pain right now. I got a pedicure today.    Currently in Pain? No/denies               Therapeutic Exercise: Nustep L3 LE only x 5 min  Rec bike ful revolutions- x 5 minutes  Gait with single crutch - focus on heel strike and knee flexion  (brace open from 0-90)  Passive knee flexion 5 x 30 sec  Quad set x 10 SLR 2 x 15- focus on initial quad set  SAQ over small ball x 20  SLR with ER x 15 Bridge x 10 Right hip abduction x 15  Heel slide x 10 Prone hamstring curls x 20  Prone right hip extension with knee flexion LAQ x 20  red band  Sit-stand taps 10 x2  Seated scoot stretch for knee flexion 3 x 30  sec  Hamstring curls green band x 25  Modalities: ice pack right knee x 15 minutes         PT Short Term Goals - 11/07/21 1852       PT SHORT TERM GOAL #1   Title Pt will be I with HEP for Rt LE ROM and strength and allowed by protocol    Baseline 11/07/21 reports compliance, reviewed gait pattern as HEP    Time 4    Period Weeks    Status On-going    Target Date 10/22/21      PT SHORT TERM GOAL #2   Title Pt will be able to flex Rt knee to > 50 deg for progression of protocol and transfers    Baseline 11/07/21 86d flexion following AAROM heele slides    Time 4    Period Weeks    Status Achieved               PT Long Term Goals - 11/16/21 1350       PT LONG TERM GOAL #1   Title  Pt will be I with final HEP upon discharge for long term strength. 11/16/21: progression in HEP are added as indicated    Status On-going    Target Date 01/11/22      PT LONG TERM GOAL #2   Title Pt will be able to show Rt knee extension to at least 4/5 for maximal gait stability and transfers. 11/16/21: R knee and hip strength = 3+/5, pt able to complete SLR s ext lag.    Baseline unable to to test, 11/07/21 3+/5 knee extension sterngth    Status On-going    Target Date 01/11/22      PT LONG TERM GOAL #3   Title Pt will demo Rt knee AROM 0- 120 deg for full ability to transfer and show optimal functional mobility. 11/14/21: Knee flexion =87d c hard endfeel    Baseline limited by protocol; 11/07/21 0-86d PROM    Status On-going    Target Date 01/11/22      PT LONG TERM GOAL #4   Title Pt will be able to walk in the community with LRAD and min limp, pain < 3/10 for short errands, distances.: Pt is current walking c bledsoe brace opened to 90d. pt continues in Bledsoe brace per protocol    Baseline has not left her home before today since surgery; 11/07/21 Reviewed heel strike and cadence today    Status On-going    Target Date 01/11/22      PT LONG TERM GOAL #5   Title Pt will be  able to negotiate 6 or more stairs with 1 rail or AD reciprocally with good technique and knee control.    Baseline does a very small step with crutch into her home; 11/07/21 not testd. 12/230/22; Not tested    Status On-going    Target Date 01/11/22                   Plan - 11/22/21 1251     Clinical Impression Statement After bike for ROM, pt measures 95 degrees AROM knee flexion. After passive stretching, AROM improved to 98 degrees flexion. Updated HEP. Per protocol patient can remove brace.    PT Treatment/Interventions ADLs/Self Care Home Management;Stair training;Therapeutic exercise;Passive range of motion;Manual techniques;Neuromuscular re-education;Therapeutic activities;Electrical Stimulation;Functional mobility training;Balance training;Patient/family education;Scar mobilization;Gait training;DME Instruction;Cryotherapy    PT Next Visit Plan focus on flexion ROM and gait, closed chain proression   PT Home Exercise Plan Access Code: [email or web address] Code: [email or web address]  URL: [email or web address]  Date: 11/01/2021  Prepared by: Karie Mainland    Exercises  Supine Heel Slide with Strap - 3 x daily - 7 x weekly - 2 sets - 10 reps - 10-15 hold  Prone Quadriceps Stretch with Strap - 3 x daily - 7 x weekly - 1 sets - 10 reps - 30 hold  Prone Knee Flexion AAROM - 3 x daily - 7 x weekly - 1 sets - 10 reps - 30 hold  Seated Knee Extension AAROM - 3 x daily - 7 x weekly - 2 sets - 10 reps - 30 hold  Supine Quad Set - 5 x daily - 7 x weekly - 2 sets - 10 reps - 5 hold  Supine Knee Extension Strengthening - 3 x daily - 7 x weekly - 2 sets - 10 reps - 30 hold  Small Range Straight Leg Raise - 3 x daily - 7 x weekly - 2 sets - 10 reps - 30 hold    Consulted and Agree with  Plan of Care Patient             Patient will benefit from skilled therapeutic intervention in order to improve the following deficits and impairments:  Abnormal gait, Decreased activity tolerance, Decreased  endurance, Decreased knowledge of use of DME, Decreased range of motion, Decreased skin integrity, Decreased strength, Increased fascial restricitons, Pain, Postural dysfunction, Decreased mobility, Difficulty walking, Increased edema  Visit Diagnosis: S/P ORIF (open reduction internal fixation) fracture  Nondisplaced transverse fracture of right patella, initial encounter for closed fracture  Difficulty in walking, not elsewhere classified     Problem List There are no problems to display for this patient.   Sherrie Mustache, [place] 11/22/2021, 2:08 PM  [facility] [address] [place], [place], [address] Phone: [phone]   Fax:  [phone]  Name: Nichole Allen MRN: [id number] Date of Birth: 20-Aug-1976

## 2021-11-27 ENCOUNTER — Ambulatory Visit: Payer: Medicaid Other | Admitting: Physical Therapy

## 2021-11-27 ENCOUNTER — Other Ambulatory Visit: Payer: Self-pay

## 2021-11-27 ENCOUNTER — Encounter: Payer: Self-pay | Admitting: Physical Therapy

## 2021-11-27 DIAGNOSIS — Z8781 Personal history of (healed) traumatic fracture: Secondary | ICD-10-CM

## 2021-11-27 DIAGNOSIS — Z9889 Other specified postprocedural states: Secondary | ICD-10-CM | POA: Diagnosis not present

## 2021-11-27 DIAGNOSIS — M25661 Stiffness of right knee, not elsewhere classified: Secondary | ICD-10-CM

## 2021-11-27 DIAGNOSIS — S82034A Nondisplaced transverse fracture of right patella, initial encounter for closed fracture: Secondary | ICD-10-CM

## 2021-11-27 DIAGNOSIS — R262 Difficulty in walking, not elsewhere classified: Secondary | ICD-10-CM

## 2021-11-27 NOTE — Therapy (Signed)
[facility] [address] [place], [place], [address] Phone: [phone]   Fax:  [phone]  Physical Therapy Treatment  Patient Details  Name: Nichole Allen MRN: [id number] Date of Birth: 1976-11-16 Referring Provider (PT): Dr. Ramond Marrow   Encounter Date: 11/27/2021   PT End of Session - 11/27/21 1024     Visit Number 15    Number of Visits 28    Date for PT Re-Evaluation 01/11/22    Authorization Type [employer]- no auth for first 12 visits- completed for re-auth on 11/16/21 -checking to see if new auth needed for new year    Authorization - Visit Number 3    Authorization - Number of Visits 12    PT Start Time PT END TIME 1017  1115                 Past Medical History:  Diagnosis Date   Allergy    Anxiety    Depression     Past Surgical History:  Procedure Laterality Date   CESAREAN SECTION     NASAL SEPTUM SURGERY     ORIF PATELLA Right 09/19/2021   Procedure: OPEN REDUCTION INTERNAL (ORIF) FIXATION PATELLA;  Surgeon: Bjorn Pippin, MD;  Location: [facility];  Service: Orthopedics;  Laterality: Right;    There were no vitals filed for this visit.   Subjective Assessment - 11/27/21 1023     Subjective No pain.    Currently in Pain? No/denies             Therapeutic Exercise: Nustep L3 LE only x 5 min  Rec bike ful revolutions- x 5 minutes  Gait with single crutch - focus on heel strike and knee flexion  (brace open from 0-90)  Bilat heel raises R Standing h/s curls 10 x 2  Squats in // bars 10 x 2 - cues for weight shift right Tandem stance , tandem gait  SLS 10 sec x 5 Slant board stretch 60 sec   Passive knee flexion 5 x 30 sec  Hamstring curls green band x 25 SLR 2 x 10 2# Right hip abduction 2 x 10 2# Heel slide x 3 with strap  Modalities: ice pack right knee x 15 minutes    [facility] PT Assessment - 11/27/21 0001       PROM   Overall PROM Comments 97 at  beginning of session improved to 103 degrees knee flexion                 PT Short Term Goals - 11/27/21 1025       PT SHORT TERM GOAL #1   Title Pt will be I with HEP for Rt LE ROM and strength and allowed by protocol    Baseline 11/07/21 reports compliance, reviewed gait pattern as HEP    Period Weeks    Status Achieved    Target Date 10/22/21      PT SHORT TERM GOAL #2   Title Pt will be able to flex Rt knee to > 50 deg for progression of protocol and transfers    Baseline 11/07/21 86d flexion following AAROM heele slides    Period Weeks    Status Achieved    Target Date 10/22/21      PT SHORT TERM GOAL #3   Title Pt will be able to stand for ADLs with brace locked for no increased pain    Time 4    Period Weeks  Status Achieved      PT SHORT TERM GOAL #4   Title Pt will be able to report use of ice for swelling, pain up to 3-5 times per day    Baseline ices as needed    Time 4    Period Weeks    Status Achieved    Target Date 10/22/21               PT Long Term Goals - 11/16/21 1350       PT LONG TERM GOAL #1   Title Pt will be I with final HEP upon discharge for long term strength. 11/16/21: progression in HEP are added as indicated    Status On-going    Target Date 01/11/22      PT LONG TERM GOAL #2   Title Pt will be able to show Rt knee extension to at least 4/5 for maximal gait stability and transfers. 11/16/21: R knee and hip strength = 3+/5, pt able to complete SLR s ext lag.    Baseline unable to to test, 11/07/21 3+/5 knee extension sterngth    Status On-going    Target Date 01/11/22      PT LONG TERM GOAL #3   Title Pt will demo Rt knee AROM 0- 120 deg for full ability to transfer and show optimal functional mobility. 11/14/21: Knee flexion =87d c hard endfeel    Baseline limited by protocol; 11/07/21 0-86d PROM    Status On-going    Target Date 01/11/22      PT LONG TERM GOAL #4   Title Pt will be able to walk in the community  with LRAD and min limp, pain < 3/10 for short errands, distances.: Pt is current walking c bledsoe brace opened to 90d. pt continues in Bledsoe brace per protocol    Baseline has not left her home before today since surgery; 11/07/21 Reviewed heel strike and cadence today    Status On-going    Target Date 01/11/22      PT LONG TERM GOAL #5   Title Pt will be able to negotiate 6 or more stairs with 1 rail or AD reciprocally with good technique and knee control.    Baseline does a very small step with crutch into her home; 11/07/21 not testd. 12/230/22; Not tested    Status On-going    Target Date 01/11/22                   Plan - 11/27/21 1302     Clinical Impression Statement Again pt AROM improved from beginning to end of session at 103 degrees. Worked on normalizing gait pattern using one crutch. Reviewed last addition of HEP as she had questions about theraband placement.    PT Treatment/Interventions ADLs/Self Care Home Management;Stair training;Therapeutic exercise;Passive range of motion;Manual techniques;Neuromuscular re-education;Therapeutic activities;Electrical Stimulation;Functional mobility training;Balance training;Patient/family education;Scar mobilization;Gait training;DME Instruction;Cryotherapy    PT Next Visit Plan focus on flexion ROM and gait, patellar mobs and quad control, refer to prorocol    PT Home Exercise Plan Access Code: [email or web address] Code: [email or web address]  URL: [email or web address]  Date: 11/01/2021  Prepared by: Karie MainlandJennifer Paa    Exercises  Supine Heel Slide with Strap - 3 x daily - 7 x weekly - 2 sets - 10 reps - 10-15 hold  Prone Quadriceps Stretch with Strap - 3 x daily - 7 x weekly - 1 sets - 10 reps - 30 hold  Prone Knee Flexion AAROM - 3 x daily -  7 x weekly - 1 sets - 10 reps - 30 hold  Seated Knee Extension AAROM - 3 x daily - 7 x weekly - 2 sets - 10 reps - 30 hold  Supine Quad Set - 5 x daily - 7 x weekly - 2 sets - 10 reps - 5 hold  Supine  Knee Extension Strengthening - 3 x daily - 7 x weekly - 2 sets - 10 reps - 30 hold  Small Range Straight Leg Raise - 3 x daily - 7 x weekly - 2 sets - 10 reps - 30 hold    Consulted and Agree with Plan of Care Patient             Patient will benefit from skilled therapeutic intervention in order to improve the following deficits and impairments:  Abnormal gait, Decreased activity tolerance, Decreased endurance, Decreased knowledge of use of DME, Decreased range of motion, Decreased skin integrity, Decreased strength, Increased fascial restricitons, Pain, Postural dysfunction, Decreased mobility, Difficulty walking, Increased edema  Visit Diagnosis: S/P ORIF (open reduction internal fixation) fracture  Nondisplaced transverse fracture of right patella, initial encounter for closed fracture  Difficulty in walking, not elsewhere classified  Stiffness of right knee, not elsewhere classified     Problem List There are no problems to display for this patient.   Sherrie Mustache, [place] 11/27/2021, 1:03 PM  [facility] [address] [place], [place], [address] Phone: [phone]   Fax:  [phone]  Name: LARKEN URIAS MRN: [id number] Date of Birth: [birth year]

## 2021-11-29 ENCOUNTER — Encounter: Payer: Self-pay | Admitting: Physical Therapy

## 2021-11-29 ENCOUNTER — Other Ambulatory Visit: Payer: Self-pay

## 2021-11-29 ENCOUNTER — Ambulatory Visit: Payer: Medicaid Other | Admitting: Physical Therapy

## 2021-11-29 DIAGNOSIS — Z9889 Other specified postprocedural states: Secondary | ICD-10-CM | POA: Diagnosis not present

## 2021-11-29 DIAGNOSIS — M6281 Muscle weakness (generalized): Secondary | ICD-10-CM

## 2021-11-29 DIAGNOSIS — Z8781 Personal history of (healed) traumatic fracture: Secondary | ICD-10-CM

## 2021-11-29 DIAGNOSIS — M25661 Stiffness of right knee, not elsewhere classified: Secondary | ICD-10-CM

## 2021-11-29 DIAGNOSIS — S82034A Nondisplaced transverse fracture of right patella, initial encounter for closed fracture: Secondary | ICD-10-CM

## 2021-11-29 DIAGNOSIS — R600 Localized edema: Secondary | ICD-10-CM

## 2021-11-29 DIAGNOSIS — R262 Difficulty in walking, not elsewhere classified: Secondary | ICD-10-CM

## 2021-11-29 NOTE — Therapy (Signed)
[facility] [address] [place], [place], [address] Phone: [phone]   Fax:  [phone]  Physical Therapy Treatment  Patient Details  Name: Nichole Allen MRN: [id number] Date of Birth: 09/11/1976 Referring Provider (PT): Dr. Ramond Marrowax Varkey   Encounter Date: 11/29/2021   PT End of Session - 11/29/21 1038     Visit Number 16    Number of Visits 28    Date for PT Re-Evaluation 01/11/22    Authorization Type [employer]- no auth for first 12 visits- completed for re-auth on 11/16/21 -checking to see if new auth needed for new year    Authorization - Number of Visits 12    PT Start Time 1021    PT Stop Time 1117    PT Time Calculation (min) 56 min    Activity Tolerance Patient tolerated treatment well;No increased pain    Behavior During Therapy WFL for tasks assessed/performed             Past Medical History:  Diagnosis Date   Allergy    Anxiety    Depression     Past Surgical History:  Procedure Laterality Date   CESAREAN SECTION     NASAL SEPTUM SURGERY     ORIF PATELLA Right 09/19/2021   Procedure: OPEN REDUCTION INTERNAL (ORIF) FIXATION PATELLA;  Surgeon: Bjorn PippinVarkey, Dax T, MD;  Location: [facility];  Service: Orthopedics;  Laterality: Right;    There were no vitals filed for this visit.   Subjective Assessment - 11/29/21 1036     Subjective No pain at rest.  Has not been doing the band exercises much.  Trying to walk better.    Pain Score 0-No pain             [facility] Adult PT Treatment/Exercise:   Therapeutic Exercise: Recumbent bike no tension 6 min  LAQ x 20 no wgt, then x 10 with red band , incr pain  Supine SLR x 10  Supine hip flexion to 90 deg  Ball bridges x 15 Ball heel slides x 10  Single leg bridge on mat x 10 each (figure4 on Rt side for support)   Standing  Hip extension mod cues for technique and supporting leg  Wall sit 5 sec slide x 10  Wall sit with toe press  for quad 30 sec  Wall sit hold with ankle DF x 10  Wall sit hold with ankle PF x 10 Lateral walking cues for hip neutral  AROM Rt knee flexion with sheet x 5  PROM Rt knee flexion leg off the table and pulling strap     Self Care: Self stretching    Consider / progression for next session:  more standing, gait, lateral hip           PT Education - 11/29/21 1203     Education Details LE alignment when walking, gait    Person(s) Educated Patient    Methods Explanation    Comprehension Verbalized understanding              PT Short Term Goals - 11/27/21 1025       PT SHORT TERM GOAL #1   Title Pt will be I with HEP for Rt LE ROM and strength and allowed by protocol    Baseline 11/07/21 reports compliance, reviewed gait pattern as HEP    Period Weeks    Status Achieved    Target Date 10/22/21      PT  SHORT TERM GOAL #2   Title Pt will be able to flex Rt knee to > 50 deg for progression of protocol and transfers    Baseline 11/07/21 86d flexion following AAROM heele slides    Period Weeks    Status Achieved    Target Date 10/22/21      PT SHORT TERM GOAL #3   Title Pt will be able to stand for ADLs with brace locked for no increased pain    Time 4    Period Weeks    Status Achieved      PT SHORT TERM GOAL #4   Title Pt will be able to report use of ice for swelling, pain up to 3-5 times per day    Baseline ices as needed    Time 4    Period Weeks    Status Achieved    Target Date 10/22/21               PT Long Term Goals - 11/16/21 1350       PT LONG TERM GOAL #1   Title Pt will be I with final HEP upon discharge for long term strength. 11/16/21: progression in HEP are added as indicated    Status On-going    Target Date 01/11/22      PT LONG TERM GOAL #2   Title Pt will be able to show Rt knee extension to at least 4/5 for maximal gait stability and transfers. 11/16/21: R knee and hip strength = 3+/5, pt able to complete SLR s ext lag.     Baseline unable to to test, 11/07/21 3+/5 knee extension sterngth    Status On-going    Target Date 01/11/22      PT LONG TERM GOAL #3   Title Pt will demo Rt knee AROM 0- 120 deg for full ability to transfer and show optimal functional mobility. 11/14/21: Knee flexion =87d c hard endfeel    Baseline limited by protocol; 11/07/21 0-86d PROM    Status On-going    Target Date 01/11/22      PT LONG TERM GOAL #4   Title Pt will be able to walk in the community with LRAD and min limp, pain < 3/10 for short errands, distances.: Pt is current walking c bledsoe brace opened to 90d. pt continues in Bledsoe brace per protocol    Baseline has not left her home before today since surgery; 11/07/21 Reviewed heel strike and cadence today    Status On-going    Target Date 01/11/22      PT LONG TERM GOAL #5   Title Pt will be able to negotiate 6 or more stairs with 1 rail or AD reciprocally with good technique and knee control.    Baseline does a very small step with crutch into her home; 11/07/21 not testd. 12/230/22; Not tested    Status On-going    Target Date 01/11/22                   Plan - 11/29/21 1024     Clinical Impression Statement Patient continues to have abnormal gait and weakness in hips , Rt quad.  She needed cues to perform HEP with bands. Her ROM is good and she is maintaining > 100 deg.  Pushed her to stretch Rt knee in supine leg off the table with strap to 120 deg by week 12.    PT Treatment/Interventions ADLs/Self Care Home Management;Stair training;Therapeutic exercise;Passive range of motion;Manual techniques;Neuromuscular  re-education;Therapeutic activities;Electrical Stimulation;Functional mobility training;Balance training;Patient/family education;Scar mobilization;Gait training;DME Instruction;Cryotherapy    PT Next Visit Plan closed chain strength and ROM . Protocol week 10.    PT Home Exercise Plan Access Code: [email or web address] Code: [email or web address]  URL:  [email or web address]  Date: 11/01/2021  Prepared by: Karie Mainland    Exercises  Supine Heel Slide with Strap - 3 x daily - 7 x weekly - 2 sets - 10 reps - 10-15 hold  Prone Quadriceps Stretch with Strap - 3 x daily - 7 x weekly - 1 sets - 10 reps - 30 hold  Prone Knee Flexion AAROM - 3 x daily - 7 x weekly - 1 sets - 10 reps - 30 hold  Seated Knee Extension AAROM - 3 x daily - 7 x weekly - 2 sets - 10 reps - 30 hold  Supine Quad Set - 5 x daily - 7 x weekly - 2 sets - 10 reps - 5 hold  Supine Knee Extension Strengthening - 3 x daily - 7 x weekly - 2 sets - 10 reps - 30 hold  Small Range Straight Leg Raise - 3 x daily - 7 x weekly - 2 sets - 10 reps - 30 hold    Consulted and Agree with Plan of Care Patient             Patient will benefit from skilled therapeutic intervention in order to improve the following deficits and impairments:  Abnormal gait, Decreased activity tolerance, Decreased endurance, Decreased knowledge of use of DME, Decreased range of motion, Decreased skin integrity, Decreased strength, Increased fascial restricitons, Pain, Postural dysfunction, Decreased mobility, Difficulty walking, Increased edema  Visit Diagnosis: S/P ORIF (open reduction internal fixation) fracture  Nondisplaced transverse fracture of right patella, initial encounter for closed fracture  Difficulty in walking, not elsewhere classified  Stiffness of right knee, not elsewhere classified  Muscle weakness (generalized)  Localized edema     Problem List There are no problems to display for this patient.   Shakiara Lukic, PT 11/29/2021, 12:08 PM  [facility] [address] [place], [place], [address] Phone: [phone]   Fax:  [phone]  Name: Nichole Allen MRN: [id number] Date of Birth: 21-Sep-1976   Karie Mainland, PT 11/29/21 12:08 PM Phone: [phone] Fax: [phone]

## 2021-12-04 ENCOUNTER — Other Ambulatory Visit: Payer: Self-pay

## 2021-12-04 ENCOUNTER — Encounter: Payer: Self-pay | Admitting: Physical Therapy

## 2021-12-04 ENCOUNTER — Ambulatory Visit: Payer: Medicaid Other | Admitting: Physical Therapy

## 2021-12-04 DIAGNOSIS — Z8781 Personal history of (healed) traumatic fracture: Secondary | ICD-10-CM

## 2021-12-04 DIAGNOSIS — Z9889 Other specified postprocedural states: Secondary | ICD-10-CM | POA: Diagnosis not present

## 2021-12-04 DIAGNOSIS — M25661 Stiffness of right knee, not elsewhere classified: Secondary | ICD-10-CM

## 2021-12-04 DIAGNOSIS — R262 Difficulty in walking, not elsewhere classified: Secondary | ICD-10-CM

## 2021-12-04 NOTE — Therapy (Signed)
[facility] [address] [place], [place], [address] Phone: [phone]   Fax:  [phone]  Physical Therapy Treatment  Patient Details  Name: Nichole Allen MRN: [id number] Date of Birth: 02-Mar-1976 Referring Provider (PT): Dr. Ramond Marrow   Encounter Date: 12/04/2021   PT End of Session - 12/04/21 0813     Visit Number 17    Number of Visits 28    Date for PT Re-Evaluation 01/11/22    Authorization Type [employer]- no auth for first 12 visits- completed for re-auth on 11/16/21 -checking to see if new auth needed for new year    Authorization - Visit Number 5    PT Start Time 0808    PT Stop Time 0856    PT Time Calculation (min) 48 min             Past Medical History:  Diagnosis Date   Allergy    Anxiety    Depression     Past Surgical History:  Procedure Laterality Date   CESAREAN SECTION     NASAL SEPTUM SURGERY     ORIF PATELLA Right 09/19/2021   Procedure: OPEN REDUCTION INTERNAL (ORIF) FIXATION PATELLA;  Surgeon: Bjorn Pippin, MD;  Location: [facility];  Service: Orthopedics;  Laterality: Right;    There were no vitals filed for this visit.   Subjective Assessment - 12/04/21 0812     Subjective No pain.  Just a little stiff. I did my work out yesterday.    Currently in Pain? No/denies              Therapeutic Exercise: Nustep L3 LE only x 5 min  Seated LAQ 3#, knee flexion blue band  Rec bike ful revolutions- x 5 minutes  Gait without crutches  4 inch lateral step up- no UE 4 inch forward step ups- no UE Bilat heel raises, single heel raise Right gastroc stretch R SLS with left hip abduction, flexion, extension Sit-stand with RLE back 10 x 2  Tandem stance , SLS 10 sec x 5 Passive thomas stretch Prone knee flexion with strap 30 sec x 2  Manual: flexion mobs to right knee followed by Passive knee flexion 5 x 30 sec  Modalities: ice pack right knee x 15 minutes       PT Short Term Goals - 11/27/21 1025       PT SHORT TERM GOAL #1   Title Pt will be I with HEP for Rt LE ROM and strength and allowed by protocol    Baseline 11/07/21 reports compliance, reviewed gait pattern as HEP    Period Weeks    Status Achieved    Target Date 10/22/21      PT SHORT TERM GOAL #2   Title Pt will be able to flex Rt knee to > 50 deg for progression of protocol and transfers    Baseline 11/07/21 86d flexion following AAROM heele slides    Period Weeks    Status Achieved    Target Date 10/22/21      PT SHORT TERM GOAL #3   Title Pt will be able to stand for ADLs with brace locked for no increased pain    Time 4    Period Weeks    Status Achieved      PT SHORT TERM GOAL #4   Title Pt will be able to report use of ice for swelling, pain up to 3-5 times per day  Baseline ices as needed    Time 4    Period Weeks    Status Achieved    Target Date 10/22/21               PT Long Term Goals - 11/16/21 1350       PT LONG TERM GOAL #1   Title Pt will be I with final HEP upon discharge for long term strength. 11/16/21: progression in HEP are added as indicated    Status On-going    Target Date 01/11/22      PT LONG TERM GOAL #2   Title Pt will be able to show Rt knee extension to at least 4/5 for maximal gait stability and transfers. 11/16/21: R knee and hip strength = 3+/5, pt able to complete SLR s ext lag.    Baseline unable to to test, 11/07/21 3+/5 knee extension sterngth    Status On-going    Target Date 01/11/22      PT LONG TERM GOAL #3   Title Pt will demo Rt knee AROM 0- 120 deg for full ability to transfer and show optimal functional mobility. 11/14/21: Knee flexion =87d c hard endfeel    Baseline limited by protocol; 11/07/21 0-86d PROM    Status On-going    Target Date 01/11/22      PT LONG TERM GOAL #4   Title Pt will be able to walk in the community with LRAD and min limp, pain < 3/10 for short errands, distances.: Pt is  current walking c bledsoe brace opened to 90d. pt continues in Bledsoe brace per protocol    Baseline has not left her home before today since surgery; 11/07/21 Reviewed heel strike and cadence today    Status On-going    Target Date 01/11/22      PT LONG TERM GOAL #5   Title Pt will be able to negotiate 6 or more stairs with 1 rail or AD reciprocally with good technique and knee control.    Baseline does a very small step with crutch into her home; 11/07/21 not testd. 12/230/22; Not tested    Status On-going    Target Date 01/11/22                   Plan - 12/04/21 0939     Clinical Impression Statement Continued with SLS , balance and closed chain challenges. Gait is improving with single crutch. Continued manual and passive stretching to improved knee flexion. Encouraged more stretching daily at home with increased hold time. 107 degrees knee flexion achieved passively at end of sesison.    PT Treatment/Interventions ADLs/Self Care Home Management;Stair training;Therapeutic exercise;Passive range of motion;Manual techniques;Neuromuscular re-education;Therapeutic activities;Electrical Stimulation;Functional mobility training;Balance training;Patient/family education;Scar mobilization;Gait training;DME Instruction;Cryotherapy    PT Next Visit Plan closed chain strength and ROM . Protocol    PT Home Exercise Plan Access Code: [email or web address] Code: [facility]  URL: [email or web address]  Date: 11/01/2021  Prepared by: Karie Mainland    Exercises  Supine Heel Slide with Strap - 3 x daily - 7 x weekly - 2 sets - 10 reps - 10-15 hold  Prone Quadriceps Stretch with Strap - 3 x daily - 7 x weekly - 1 sets - 10 reps - 30 hold  Prone Knee Flexion AAROM - 3 x daily - 7 x weekly - 1 sets - 10 reps - 30 hold  Seated Knee Extension AAROM - 3 x daily - 7 x weekly - 2 sets - 10 reps -  30 hold  Supine Quad Set - 5 x daily - 7 x weekly - 2 sets - 10 reps - 5 hold  Supine Knee Extension  Strengthening - 3 x daily - 7 x weekly - 2 sets - 10 reps - 30 hold  Small Range Straight Leg Raise - 3 x daily - 7 x weekly - 2 sets - 10 reps - 30 hold    Consulted and Agree with Plan of Care Patient             Patient will benefit from skilled therapeutic intervention in order to improve the following deficits and impairments:  Abnormal gait, Decreased activity tolerance, Decreased endurance, Decreased knowledge of use of DME, Decreased range of motion, Decreased skin integrity, Decreased strength, Increased fascial restricitons, Pain, Postural dysfunction, Decreased mobility, Difficulty walking, Increased edema  Visit Diagnosis: S/P ORIF (open reduction internal fixation) fracture  Difficulty in walking, not elsewhere classified  Stiffness of right knee, not elsewhere classified     Problem List There are no problems to display for this patient.   Sherrie MustacheDonoho, Kenza Munar McGee, [place] 12/04/2021, 9:40 AM  [facility] [address] [place], [place], [address] Phone: [phone]   Fax:  [phone]  Name: Nichole Allen MRN: [id number] Date of Birth: [birth year]

## 2021-12-06 ENCOUNTER — Ambulatory Visit: Payer: Medicaid Other | Admitting: Physical Therapy

## 2021-12-06 ENCOUNTER — Other Ambulatory Visit: Payer: Self-pay

## 2021-12-06 ENCOUNTER — Encounter: Payer: Self-pay | Admitting: Physical Therapy

## 2021-12-06 DIAGNOSIS — Z8781 Personal history of (healed) traumatic fracture: Secondary | ICD-10-CM

## 2021-12-06 DIAGNOSIS — M25661 Stiffness of right knee, not elsewhere classified: Secondary | ICD-10-CM

## 2021-12-06 DIAGNOSIS — Z9889 Other specified postprocedural states: Secondary | ICD-10-CM

## 2021-12-06 DIAGNOSIS — R262 Difficulty in walking, not elsewhere classified: Secondary | ICD-10-CM

## 2021-12-06 NOTE — Therapy (Signed)
[place], [place], [address] Phone: [phone]   Fax:  [phone]  Physical Therapy Treatment  Patient Details  Name: Nichole Allen MRN: [id number] Date of Birth: Dec 24, 1975 Referring Provider (PT): Dr. Ophelia Charter   Encounter Date: 12/06/2021   PT End of Session - 12/06/21 1021     Visit Number 18    Number of Visits 28    Date for PT Re-Evaluation 01/11/22    Authorization Type [employer]- no auth for first 12 visits- completed for re-auth on 11/16/21    Authorization Time Period 11/20/21-01/19/22    Authorization - Visit Number 6    Authorization - Number of Visits 12    PT Start Time 1017    PT Stop Time 1108    PT Time Calculation (min) 51 min             Past Medical History:  Diagnosis Date   Allergy    Anxiety    Depression     Past Surgical History:  Procedure Laterality Date   CESAREAN SECTION     NASAL SEPTUM SURGERY     ORIF PATELLA Right 09/19/2021   Procedure: OPEN REDUCTION INTERNAL (ORIF) FIXATION PATELLA;  Surgeon: Hiram Gash, MD;  Location: [facility];  Service: Orthopedics;  Laterality: Right;    There were no vitals filed for this visit.   Subjective Assessment - 12/06/21 1020     Subjective No pain. Was a little sore from squats but I felt better after stretching.    Currently in Pain? No/denies                 Therapeutic Exercise: 12/06/21   Rec bike full revolutions, lowering seat for ROM, 7 minutes Manual performed at this time Hip flexor stretch- passive x 3 Prone passive quad stretch with mob x 3 Stool scooting 50 ft x 2 SLS holding black physioball - ABCs x 1  6 inch step up 1 UE- with opp knee drive- cues for controlled step down.  Leg press bilat 1 plate, eccentric right LE x 10 each  Sit-stand with RLE back 10 x 2  Quadruped rocking into childs pose 10 sec x 5 for knee flexion ROM Manual: flexion mobs to right knee  followed by Passive knee flexion 5 x 30 sec  Modalities: ice pack right knee x 15 minutes          PT Short Term Goals - 11/27/21 1025       PT SHORT TERM GOAL #1   Title Pt will be I with HEP for Rt LE ROM and strength and allowed by protocol    Baseline 11/07/21 reports compliance, reviewed gait pattern as HEP    Period Weeks    Status Achieved    Target Date 10/22/21      PT SHORT TERM GOAL #2   Title Pt will be able to flex Rt knee to > 50 deg for progression of protocol and transfers    Baseline 11/07/21 86d flexion following AAROM heele slides    Period Weeks    Status Achieved    Target Date 10/22/21      PT SHORT TERM GOAL #3   Title Pt will be able to stand for ADLs with brace locked for no increased pain    Time 4    Period Weeks    Status Achieved      PT SHORT TERM GOAL #4  Title Pt will be able to report use of ice for swelling, pain up to 3-5 times per day    Baseline ices as needed    Time 4    Period Weeks    Status Achieved    Target Date 10/22/21               PT Long Term Goals - 12/06/21 1109       PT LONG TERM GOAL #1   Title Pt will be I with final HEP upon discharge for long term strength. 11/16/21: progression in HEP are added as indicated    Time 8    Period Weeks    Status On-going      PT LONG TERM GOAL #2   Title Pt will be able to show Rt knee extension to at least 4/5 for maximal gait stability and transfers. 11/16/21: R knee and hip strength = 3+/5, pt able to complete SLR s ext lag.    Baseline unable to to test, 11/07/21 3+/5 knee extension sterngth    Time 8    Period Weeks    Status On-going    Target Date 01/11/22      PT LONG TERM GOAL #3   Title Pt will demo Rt knee AROM 0- 120 deg for full ability to transfer and show optimal functional mobility. 11/14/21: Knee flexion =87d c hard endfeel    Baseline limited by protocol; 11/07/21 0-86d PROM; 12/06/21: 0-112 PROM    Time 8    Period Weeks    Status On-going     Target Date 01/11/22      PT LONG TERM GOAL #4   Title Pt will be able to walk in the community with LRAD and min limp, pain < 3/10 for short errands, distances.: Pt is current walking c bledsoe brace opened to 90d. pt continues in Bledsoe brace per protocol    Baseline has not left her home before today since surgery; 11/07/21 Reviewed heel strike and cadence today: 12/16/21: using 1 crutch , no brace,improving gait pattern    Time 8    Period Weeks    Status On-going    Target Date 01/11/22      PT LONG TERM GOAL #5   Title Pt will be able to negotiate 6 or more stairs with 1 rail or AD reciprocally with good technique and knee control.    Baseline does a very small step with crutch into her home; 11/07/21 not testd. 12/230/22; Not tested; 12/06/21- performing step ups- step downs not tested    Time 8    Period Weeks    Status On-going                   Plan - 12/06/21 1102     Clinical Impression Statement Able to achieve 112 PROM knee flexion by end of session. Progressed with 6 inch step ups and single leg press, more dynamic balance. Increased time spent with manual and stretching in multiple positions including quadruped. Pt tolerted session well without increased pain.    PT Treatment/Interventions ADLs/Self Care Home Management;Stair training;Therapeutic exercise;Passive range of motion;Manual techniques;Neuromuscular re-education;Therapeutic activities;Electrical Stimulation;Functional mobility training;Balance training;Patient/family education;Scar mobilization;Gait training;DME Instruction;Cryotherapy    PT Next Visit Plan closed chain strength and ROM . Protocol    PT Home Exercise Plan Access Code: [email or web address] Code: [facility]  URL: [email or web address]  Date: 11/01/2021  Prepared by: Raeford Razor    Exercises  Supine Heel Slide with Strap - 3 x  daily - 7 x weekly - 2 sets - 10 reps - 10-15 hold  Prone Quadriceps Stretch with Strap - 3 x daily - 7 x weekly  - 1 sets - 10 reps - 30 hold  Prone Knee Flexion AAROM - 3 x daily - 7 x weekly - 1 sets - 10 reps - 30 hold  Seated Knee Extension AAROM - 3 x daily - 7 x weekly - 2 sets - 10 reps - 30 hold  Supine Quad Set - 5 x daily - 7 x weekly - 2 sets - 10 reps - 5 hold  Supine Knee Extension Strengthening - 3 x daily - 7 x weekly - 2 sets - 10 reps - 30 hold  Small Range Straight Leg Raise - 3 x daily - 7 x weekly - 2 sets - 10 reps - 30 hold    Consulted and Agree with Plan of Care Patient             Patient will benefit from skilled therapeutic intervention in order to improve the following deficits and impairments:  Abnormal gait, Decreased activity tolerance, Decreased endurance, Decreased knowledge of use of DME, Decreased range of motion, Decreased skin integrity, Decreased strength, Increased fascial restricitons, Pain, Postural dysfunction, Decreased mobility, Difficulty walking, Increased edema  Visit Diagnosis: S/P ORIF (open reduction internal fixation) fracture  Difficulty in walking, not elsewhere classified  Stiffness of right knee, not elsewhere classified     Problem List There are no problems to display for this patient.   Dorene Ar, [place] 12/06/2021, 11:11 AM  [place], [place], [address] Phone: [phone]   Fax:  [phone]  Name: Nichole Allen MRN: [id number] Date of Birth: 05-31-1976

## 2021-12-10 ENCOUNTER — Ambulatory Visit: Payer: Medicaid Other | Admitting: Physical Therapy

## 2021-12-10 ENCOUNTER — Other Ambulatory Visit: Payer: Self-pay

## 2021-12-10 ENCOUNTER — Encounter: Payer: Self-pay | Admitting: Physical Therapy

## 2021-12-10 DIAGNOSIS — M25661 Stiffness of right knee, not elsewhere classified: Secondary | ICD-10-CM

## 2021-12-10 DIAGNOSIS — Z9889 Other specified postprocedural states: Secondary | ICD-10-CM | POA: Diagnosis not present

## 2021-12-10 DIAGNOSIS — Z8781 Personal history of (healed) traumatic fracture: Secondary | ICD-10-CM

## 2021-12-10 DIAGNOSIS — R262 Difficulty in walking, not elsewhere classified: Secondary | ICD-10-CM

## 2021-12-10 NOTE — Therapy (Signed)
[place], [place], [address] Phone: [phone]   Fax:  [phone]  Physical Therapy Treatment  Patient Details  Name: Nichole Allen MRN: [id number] Date of Birth: Jul 24, 1976 Referring Provider (PT): Dr. Ophelia Charter   Encounter Date: 12/10/2021   PT End of Session - 12/10/21 1236     Visit Number 19    Number of Visits 28    Date for PT Re-Evaluation 01/11/22    Authorization Type [employer]- no auth for first 12 visits- completed for re-auth on 11/16/21    Authorization Time Period 11/20/21-01/19/22    Authorization - Visit Number 7    Authorization - Number of Visits 12    PT Start Time 1230    PT Stop Time 1325    PT Time Calculation (min) 55 min             Past Medical History:  Diagnosis Date   Allergy    Anxiety    Depression     Past Surgical History:  Procedure Laterality Date   CESAREAN SECTION     NASAL SEPTUM SURGERY     ORIF PATELLA Right 09/19/2021   Procedure: OPEN REDUCTION INTERNAL (ORIF) FIXATION PATELLA;  Surgeon: Hiram Gash, MD;  Location: [facility];  Service: Orthopedics;  Laterality: Right;    There were no vitals filed for this visit.   Subjective Assessment - 12/10/21 1235     Subjective No pain. No feeling like it is going to buckle.    Currently in Pain? No/denies                 Therapeutic Exercise: 12/10/21   Rec bike full revolutions, lowering seat for ROM, 7 minutes Stair climbing: reciprocal up and down with bilat hand rails 6 inch step ups right x 15 4 inch lateral step down x 15 right Leg press bilat 2 plates x 25, eccentric 1 plate x 25 single right leg Manual performed at this time Hip flexor stretch- passive x 3 Prone passive quad stretch with mob x 3 Prone hamstring curl 5# x 20  Stool scooting 50 ft x 2 Long sitting hamstring stretch  Quadruped rocking into childs pose 10 sec x 5 for knee flexion ROM (used  high bolster to support upper body) SLS holding black physioball - ABCs x 1  Sit-stand with RLE back 10 x 2   Manual: flexion mobs to right knee followed by Passive knee flexion 5 x 30 sec  Modalities: ice pack right knee x 15 minutes     [facility] PT Assessment - 12/10/21 0001       PROM   Overall PROM Comments 110 at begining of session, 120 at end of session.      Strength   Right Knee Flexion 4/5    Right Knee Extension 4/5                   PT Short Term Goals - 11/27/21 1025       PT SHORT TERM GOAL #1   Title Pt will be I with HEP for Rt LE ROM and strength and allowed by protocol    Baseline 11/07/21 reports compliance, reviewed gait pattern as HEP    Period Weeks    Status Achieved    Target Date 10/22/21      PT SHORT TERM GOAL #2   Title Pt will be able to flex Rt knee to > 50  deg for progression of protocol and transfers    Baseline 11/07/21 86d flexion following AAROM heele slides    Period Weeks    Status Achieved    Target Date 10/22/21      PT SHORT TERM GOAL #3   Title Pt will be able to stand for ADLs with brace locked for no increased pain    Time 4    Period Weeks    Status Achieved      PT SHORT TERM GOAL #4   Title Pt will be able to report use of ice for swelling, pain up to 3-5 times per day    Baseline ices as needed    Time 4    Period Weeks    Status Achieved    Target Date 10/22/21               PT Long Term Goals - 12/10/21 1322       PT LONG TERM GOAL #1   Title Pt will be I with final HEP upon discharge for long term strength. 11/16/21: progression in HEP are added as indicated    Time 8    Period Weeks    Status On-going    Target Date 01/11/22      PT LONG TERM GOAL #2   Title Pt will be able to show Rt knee extension to at least 4/5 for maximal gait stability and transfers. 11/16/21: R knee and hip strength = 3+/5, pt able to complete SLR s ext lag. 12/10/21: 4/5    Time 8    Period Weeks    Status Achieved     Target Date 01/11/22      PT LONG TERM GOAL #3   Title Pt will demo Rt knee AROM 0- 120 deg for full ability to transfer and show optimal functional mobility. 11/14/21: Knee flexion =87d c hard endfeel    Baseline limited by protocol; 11/07/21 0-86d PROM; 12/06/21: 0-112 PROM; 12/10/21: achieved 0- 120 AROM at end of session    Time 8    Period Weeks    Status Partially Met    Target Date 01/11/22      PT LONG TERM GOAL #4   Title Pt will be able to walk in the community with LRAD and min limp, pain < 3/10 for short errands, distances.: Pt is current walking c bledsoe brace opened to 90d. pt continues in Bledsoe brace per protocol    Baseline has not left her home before today since surgery; 11/07/21 Reviewed heel strike and cadence today: 11/15/22: using 1 crutch , no brace,improving gait pattern;    Time 8    Period Weeks    Status On-going    Target Date 01/11/22      PT LONG TERM GOAL #5   Title Pt will be able to negotiate 6 or more stairs with 1 rail or AD reciprocally with good technique and knee control.    Baseline does a very small step with crutch into her home; 11/07/21 not testd. 12/230/22; Not tested; 12/06/21- performing step ups- step downs not tested; 12/10/21: uses bilat HR    Time 8    Period Weeks    Status On-going    Target Date 01/11/22                   Plan - 12/10/21 1313     Clinical Impression Statement Able to achieve 120 active knee flexion by end of session today. Began stair  climbing with ability to negotiate up and down with bilateral handrails. Continued with ROM focus and quad activation.    PT Treatment/Interventions ADLs/Self Care Home Management;Stair training;Therapeutic exercise;Passive range of motion;Manual techniques;Neuromuscular re-education;Therapeutic activities;Electrical Stimulation;Functional mobility training;Balance training;Patient/family education;Scar mobilization;Gait training;DME Instruction;Cryotherapy    PT Next Visit  Plan closed chain strength and ROM . Protocol, progrssiv step exercises    PT Home Exercise Plan Access Code: [email or web address] Code: [email or web address]  URL: [email or web address]  Date: 11/01/2021  Prepared by: Raeford Razor    Exercises  Supine Heel Slide with Strap - 3 x daily - 7 x weekly - 2 sets - 10 reps - 10-15 hold  Prone Quadriceps Stretch with Strap - 3 x daily - 7 x weekly - 1 sets - 10 reps - 30 hold  Prone Knee Flexion AAROM - 3 x daily - 7 x weekly - 1 sets - 10 reps - 30 hold  Seated Knee Extension AAROM - 3 x daily - 7 x weekly - 2 sets - 10 reps - 30 hold  Supine Quad Set - 5 x daily - 7 x weekly - 2 sets - 10 reps - 5 hold  Supine Knee Extension Strengthening - 3 x daily - 7 x weekly - 2 sets - 10 reps - 30 hold  Small Range Straight Leg Raise - 3 x daily - 7 x weekly - 2 sets - 10 reps - 30 hold    Consulted and Agree with Plan of Care Patient             Patient will benefit from skilled therapeutic intervention in order to improve the following deficits and impairments:  Abnormal gait, Decreased activity tolerance, Decreased endurance, Decreased knowledge of use of DME, Decreased range of motion, Decreased skin integrity, Decreased strength, Increased fascial restricitons, Pain, Postural dysfunction, Decreased mobility, Difficulty walking, Increased edema  Visit Diagnosis: S/P ORIF (open reduction internal fixation) fracture  Difficulty in walking, not elsewhere classified  Stiffness of right knee, not elsewhere classified     Problem List There are no problems to display for this patient.   Dorene Ar, [place] 12/10/2021, 1:25 PM  [place], [place], [address] Phone: [phone]   Fax:  [phone]  Name: Nichole Allen MRN: [id number] Date of Birth: 29-Aug-1976

## 2021-12-12 ENCOUNTER — Ambulatory Visit: Payer: Medicaid Other | Admitting: Physical Therapy

## 2021-12-17 ENCOUNTER — Other Ambulatory Visit: Payer: Self-pay

## 2021-12-17 ENCOUNTER — Encounter: Payer: Self-pay | Admitting: Physical Therapy

## 2021-12-17 ENCOUNTER — Ambulatory Visit: Payer: Medicaid Other | Admitting: Physical Therapy

## 2021-12-17 DIAGNOSIS — Z9889 Other specified postprocedural states: Secondary | ICD-10-CM

## 2021-12-17 DIAGNOSIS — Z8781 Personal history of (healed) traumatic fracture: Secondary | ICD-10-CM

## 2021-12-17 DIAGNOSIS — R262 Difficulty in walking, not elsewhere classified: Secondary | ICD-10-CM

## 2021-12-17 NOTE — Therapy (Signed)
[place], [place], [address] Phone: [phone]   Fax:  [phone]  Physical Therapy Treatment  Patient Details  Name: Nichole Allen MRN: [id number] Date of Birth: 06-29-1976 Referring Provider (PT): Dr. Ophelia Charter   Encounter Date: 12/17/2021   PT End of Session - 12/17/21 1059     Visit Number 20    Number of Visits 28    Date for PT Re-Evaluation 01/11/22    Authorization Type [employer]- no auth for first 12 visits- completed for re-auth on 11/16/21    Authorization Time Period 11/20/21-01/19/22    Authorization - Visit Number 8    Authorization - Number of Visits 12    PT Start Time 1057    PT Stop Time 1155    PT Time Calculation (min) 58 min             Past Medical History:  Diagnosis Date   Allergy    Anxiety    Depression     Past Surgical History:  Procedure Laterality Date   CESAREAN SECTION     NASAL SEPTUM SURGERY     ORIF PATELLA Right 09/19/2021   Procedure: OPEN REDUCTION INTERNAL (ORIF) FIXATION PATELLA;  Surgeon: Hiram Gash, MD;  Location: [facility];  Service: Orthopedics;  Laterality: Right;    There were no vitals filed for this visit.   Subjective Assessment - 12/17/21 1058     Subjective Saw the PA-C and was told I could discontinue the crutch.    Currently in Pain? No/denies                [facility] PT Assessment - 12/17/21 0001       PROM   Overall PROM Comments AROM 115 at beginning of session                Therapeutic Exercise: 12/10/21   Rec bike full revolutions, lowering seat for ROM, 7 minutes Manual performed at this time 8 inch step ups right x 15 single UE 4 inch lateral step down x 15 right- single UE 4 inch retro step up x 15 right- bilat UE Leg press bilat 2 plates x 25, eccentric 1 plate x 25 single right leg 10# KB squat -tap to chair x 20  Stool scoot 50 feet  Knee ext bilat 5# x 20 Knee flex bilat  25# x 20 Single heel raise x 25 Gastroc stretch SLS on foam with ABC using black physioball Wall slide 5 sec x 15 Single hip hinge-Towel slide back with LLE x 10 -   Manual: flexion mobs to right knee followed by Passive knee flexion 5 x 30 sec  Modalities: ice pack right knee x 15 minutes                       PT Short Term Goals - 11/27/21 1025       PT SHORT TERM GOAL #1   Title Pt will be I with HEP for Rt LE ROM and strength and allowed by protocol    Baseline 11/07/21 reports compliance, reviewed gait pattern as HEP    Period Weeks    Status Achieved    Target Date 10/22/21      PT SHORT TERM GOAL #2   Title Pt will be able to flex Rt knee to > 50 deg for progression of protocol and transfers    Baseline 11/07/21 86d flexion following  AAROM heele slides    Period Weeks    Status Achieved    Target Date 10/22/21      PT SHORT TERM GOAL #3   Title Pt will be able to stand for ADLs with brace locked for no increased pain    Time 4    Period Weeks    Status Achieved      PT SHORT TERM GOAL #4   Title Pt will be able to report use of ice for swelling, pain up to 3-5 times per day    Baseline ices as needed    Time 4    Period Weeks    Status Achieved    Target Date 10/22/21               PT Long Term Goals - 12/10/21 1322       PT LONG TERM GOAL #1   Title Pt will be I with final HEP upon discharge for long term strength. 11/16/21: progression in HEP are added as indicated    Time 8    Period Weeks    Status On-going    Target Date 01/11/22      PT LONG TERM GOAL #2   Title Pt will be able to show Rt knee extension to at least 4/5 for maximal gait stability and transfers. 11/16/21: R knee and hip strength = 3+/5, pt able to complete SLR s ext lag. 12/10/21: 4/5    Time 8    Period Weeks    Status Achieved    Target Date 01/11/22      PT LONG TERM GOAL #3   Title Pt will demo Rt knee AROM 0- 120 deg for full ability to transfer and  show optimal functional mobility. 11/14/21: Knee flexion =87d c hard endfeel    Baseline limited by protocol; 11/07/21 0-86d PROM; 12/06/21: 0-112 PROM; 12/10/21: achieved 0- 120 AROM at end of session    Time 8    Period Weeks    Status Partially Met    Target Date 01/11/22      PT LONG TERM GOAL #4   Title Pt will be able to walk in the community with LRAD and min limp, pain < 3/10 for short errands, distances.: Pt is current walking c bledsoe brace opened to 90d. pt continues in Bledsoe brace per protocol    Baseline has not left her home before today since surgery; 11/07/21 Reviewed heel strike and cadence today: 11/15/22: using 1 crutch , no brace,improving gait pattern;    Time 8    Period Weeks    Status On-going    Target Date 01/11/22      PT LONG TERM GOAL #5   Title Pt will be able to negotiate 6 or more stairs with 1 rail or AD reciprocally with good technique and knee control.    Baseline does a very small step with crutch into her home; 11/07/21 not testd. 12/230/22; Not tested; 12/06/21- performing step ups- step downs not tested; 12/10/21: uses bilat HR    Time 8    Period Weeks    Status On-going    Target Date 01/11/22                   Plan - 12/17/21 1231     Clinical Impression Statement Nichole Allen started the session with 115 degrees knee flexion and improved to 118 by end of session. Introduced knee strengthening machines and she tolerated them well. Increased closed chain  focus with step ups and wall squats. She was given additional closed chain exercises for HEP.    PT Treatment/Interventions ADLs/Self Care Home Management;Stair training;Therapeutic exercise;Passive range of motion;Manual techniques;Neuromuscular re-education;Therapeutic activities;Electrical Stimulation;Functional mobility training;Balance training;Patient/family education;Scar mobilization;Gait training;DME Instruction;Cryotherapy    PT Next Visit Plan closed chain strength and ROM . Protocol,  progrssiv step exercises    PT Home Exercise Plan Access Code: [email or web address]    Consulted and Agree with Plan of Care Patient             Patient will benefit from skilled therapeutic intervention in order to improve the following deficits and impairments:  Abnormal gait, Decreased activity tolerance, Decreased endurance, Decreased knowledge of use of DME, Decreased range of motion, Decreased skin integrity, Decreased strength, Increased fascial restricitons, Pain, Postural dysfunction, Decreased mobility, Difficulty walking, Increased edema  Visit Diagnosis: S/P ORIF (open reduction internal fixation) fracture  Difficulty in walking, not elsewhere classified     Problem List There are no problems to display for this patient.   Dorene Ar, [place] 12/17/2021, 2:31 PM  [facility] [address] [place], [place], [address] Phone: [phone]   Fax:  [phone]  Name: Nichole Allen MRN: [id number] Date of Birth: [birth year]

## 2021-12-19 ENCOUNTER — Ambulatory Visit: Payer: Medicaid Other | Admitting: Physical Therapy

## 2021-12-24 ENCOUNTER — Other Ambulatory Visit: Payer: Self-pay

## 2021-12-24 ENCOUNTER — Ambulatory Visit: Payer: Medicaid Other | Attending: Orthopaedic Surgery | Admitting: Physical Therapy

## 2021-12-24 ENCOUNTER — Encounter: Payer: Self-pay | Admitting: Physical Therapy

## 2021-12-24 DIAGNOSIS — M6281 Muscle weakness (generalized): Secondary | ICD-10-CM | POA: Insufficient documentation

## 2021-12-24 DIAGNOSIS — M25661 Stiffness of right knee, not elsewhere classified: Secondary | ICD-10-CM | POA: Diagnosis present

## 2021-12-24 DIAGNOSIS — Z9889 Other specified postprocedural states: Secondary | ICD-10-CM | POA: Diagnosis not present

## 2021-12-24 DIAGNOSIS — S82034A Nondisplaced transverse fracture of right patella, initial encounter for closed fracture: Secondary | ICD-10-CM | POA: Diagnosis present

## 2021-12-24 DIAGNOSIS — R262 Difficulty in walking, not elsewhere classified: Secondary | ICD-10-CM | POA: Insufficient documentation

## 2021-12-24 DIAGNOSIS — Z8781 Personal history of (healed) traumatic fracture: Secondary | ICD-10-CM | POA: Insufficient documentation

## 2021-12-24 DIAGNOSIS — R600 Localized edema: Secondary | ICD-10-CM | POA: Diagnosis present

## 2021-12-24 NOTE — Therapy (Signed)
[place], [place], [address] Phone: [phone]   Fax:  [phone]  Physical Therapy Treatment  Patient Details  Name: Nichole Allen MRN: [id number] Date of Birth: [birth year] Referring Provider (PT): Dr. Ophelia Charter   Encounter Date: 12/24/2021   PT End of Session - 12/24/21 1106     Visit Number 21    Number of Visits 28    Date for PT Re-Evaluation 01/11/22    Authorization Type [employer]- no auth for first 12 visits- completed for re-auth on 11/16/21    Authorization Time Period 11/20/21-01/19/22    Authorization - Visit Number 9    Authorization - Number of Visits 12    PT Start Time 1102    PT Stop Time 1200    PT Time Calculation (min) 58 min             Past Medical History:  Diagnosis Date   Allergy    Anxiety    Depression     Past Surgical History:  Procedure Laterality Date   CESAREAN SECTION     NASAL SEPTUM SURGERY     ORIF PATELLA Right 09/19/2021   Procedure: OPEN REDUCTION INTERNAL (ORIF) FIXATION PATELLA;  Surgeon: Hiram Gash, MD;  Location: [facility];  Service: Orthopedics;  Laterality: Right;    There were no vitals filed for this visit.   Subjective Assessment - 12/24/21 1105     Subjective I worked 6 hours and I felt a little pain in my knee afterward.    Currently in Pain? No/denies             Therapeutic Exercise: 12/24/21  Elliptical x 5 min Resistance 3  Wall slides 5 sec x 15 Knee ext bilat 5#  eccentricx 10, 10# bilat 4 inch retro step up x 15 right- single UE Leg press bilat 2 plates x 10, eccentric 2 plate x 25  37# KB squat -tap to chair x 20  Stool scoot 50 feet x2 SLS on foam with ABC using black physioball Single leg squat with arm reach- counter support  Attempted cone drill - single leg- unable   Manual: flexion mobs to right knee followed by Passive knee flexion 5 x 30 sec  Modalities: ice pack right knee x 15  minutes     [facility] PT Assessment - 12/24/21 0001       PROM   Overall PROM Comments 123 measured mid session AROM   127 PROM                    PT Short Term Goals - 11/27/21 1025       PT SHORT TERM GOAL #1   Title Pt will be I with HEP for Rt LE ROM and strength and allowed by protocol    Baseline 11/07/21 reports compliance, reviewed gait pattern as HEP    Period Weeks    Status Achieved    Target Date 10/22/21      PT SHORT TERM GOAL #2   Title Pt will be able to flex Rt knee to > 50 deg for progression of protocol and transfers    Baseline 11/07/21 86d flexion following AAROM heele slides    Period Weeks    Status Achieved    Target Date 10/22/21      PT SHORT TERM GOAL #3   Title Pt will be able to stand for ADLs with brace locked for no  increased pain    Time 4    Period Weeks    Status Achieved      PT SHORT TERM GOAL #4   Title Pt will be able to report use of ice for swelling, pain up to 3-5 times per day    Baseline ices as needed    Time 4    Period Weeks    Status Achieved    Target Date 10/22/21               PT Long Term Goals - 12/10/21 1322       PT LONG TERM GOAL #1   Title Pt will be I with final HEP upon discharge for long term strength. 11/16/21: progression in HEP are added as indicated    Time 8    Period Weeks    Status On-going    Target Date 01/11/22      PT LONG TERM GOAL #2   Title Pt will be able to show Rt knee extension to at least 4/5 for maximal gait stability and transfers. 11/16/21: R knee and hip strength = 3+/5, pt able to complete SLR s ext lag. 12/10/21: 4/5    Time 8    Period Weeks    Status Achieved    Target Date 01/11/22      PT LONG TERM GOAL #3   Title Pt will demo Rt knee AROM 0- 120 deg for full ability to transfer and show optimal functional mobility. 11/14/21: Knee flexion =87d c hard endfeel    Baseline limited by protocol; 11/07/21 0-86d PROM; 12/06/21: 0-112 PROM; 12/10/21: achieved 0- 120  AROM at end of session    Time 8    Period Weeks    Status Partially Met    Target Date 01/11/22      PT LONG TERM GOAL #4   Title Pt will be able to walk in the community with LRAD and min limp, pain < 3/10 for short errands, distances.: Pt is current walking c bledsoe brace opened to 90d. pt continues in Bledsoe brace per protocol    Baseline has not left her home before today since surgery; 11/07/21 Reviewed heel strike and cadence today: 11/15/22: using 1 crutch , no brace,improving gait pattern;    Time 8    Period Weeks    Status On-going    Target Date 01/11/22      PT LONG TERM GOAL #5   Title Pt will be able to negotiate 6 or more stairs with 1 rail or AD reciprocally with good technique and knee control.    Baseline does a very small step with crutch into her home; 11/07/21 not testd. 12/230/22; Not tested; 12/06/21- performing step ups- step downs not tested; 12/10/21: uses bilat HR    Time 8    Period Weeks    Status On-going    Target Date 01/11/22                   Plan - 12/24/21 1304     Clinical Impression Statement [name] achieved 128 degrees of PROM today. Her AROM was 123. Continued with progressive knee strengthening will single leg challenges. Updated HEP.    PT Treatment/Interventions ADLs/Self Care Home Management;Stair training;Therapeutic exercise;Passive range of motion;Manual techniques;Neuromuscular re-education;Therapeutic activities;Electrical Stimulation;Functional mobility training;Balance training;Patient/family education;Scar mobilization;Gait training;DME Instruction;Cryotherapy    PT Next Visit Plan closed chain strength and ROM . Protocol, progrssive step exercises, single leg    PT Home Exercise Plan Access  Code: [email or web address]    Consulted and Agree with Plan of Care Patient             Patient will benefit from skilled therapeutic intervention in order to improve the following deficits and impairments:  Abnormal gait, Decreased activity  tolerance, Decreased endurance, Decreased knowledge of use of DME, Decreased range of motion, Decreased skin integrity, Decreased strength, Increased fascial restricitons, Pain, Postural dysfunction, Decreased mobility, Difficulty walking, Increased edema  Visit Diagnosis: S/P ORIF (open reduction internal fixation) fracture  Difficulty in walking, not elsewhere classified  Stiffness of right knee, not elsewhere classified     Problem List There are no problems to display for this patient.   Dorene Ar, [place] 12/24/2021, 1:07 PM  [facility] [address] [place], [place], [address] Phone: [phone]   Fax:  [phone]  Name: KINAYA HILLIKER MRN: [id number] Date of Birth: [birth year]

## 2021-12-26 ENCOUNTER — Ambulatory Visit: Payer: Medicaid Other | Admitting: Physical Therapy

## 2021-12-31 ENCOUNTER — Encounter: Payer: Self-pay | Admitting: Physical Therapy

## 2021-12-31 ENCOUNTER — Ambulatory Visit: Payer: Medicaid Other | Admitting: Physical Therapy

## 2021-12-31 ENCOUNTER — Other Ambulatory Visit: Payer: Self-pay

## 2021-12-31 DIAGNOSIS — Z8781 Personal history of (healed) traumatic fracture: Secondary | ICD-10-CM

## 2021-12-31 DIAGNOSIS — Z9889 Other specified postprocedural states: Secondary | ICD-10-CM

## 2021-12-31 DIAGNOSIS — R262 Difficulty in walking, not elsewhere classified: Secondary | ICD-10-CM

## 2021-12-31 DIAGNOSIS — M25661 Stiffness of right knee, not elsewhere classified: Secondary | ICD-10-CM

## 2021-12-31 NOTE — Therapy (Signed)
[place], [place], [address] Phone: [phone]   Fax:  [phone]  Physical Therapy Treatment  Patient Details  Name: MAYSEN BONSIGNORE MRN: [id number] Date of Birth: 1976/10/17 Referring Provider (PT): Dr. Ophelia Charter   Encounter Date: 12/31/2021   PT End of Session - 12/31/21 1018     Visit Number 22    Number of Visits 28    Date for PT Re-Evaluation 01/11/22    Authorization Type [employer]- no auth for first 12 visits- completed for re-auth on 11/16/21    Authorization Time Period 11/20/21-01/19/22    Authorization - Visit Number 10    Authorization - Number of Visits 12    PT Start Time 2500    PT Stop Time 1110    PT Time Calculation (min) 55 min             Past Medical History:  Diagnosis Date   Allergy    Anxiety    Depression     Past Surgical History:  Procedure Laterality Date   CESAREAN SECTION     NASAL SEPTUM SURGERY     ORIF PATELLA Right 09/19/2021   Procedure: OPEN REDUCTION INTERNAL (ORIF) FIXATION PATELLA;  Surgeon: Hiram Gash, MD;  Location: [facility];  Service: Orthopedics;  Laterality: Right;    There were no vitals filed for this visit.   Subjective Assessment - 12/31/21 1017     Subjective Sometimes in the mornings the knee is a little stiff.    Currently in Pain? No/denies             Therapeutic Exercise: 12/24/21  Elliptical x 5 min Resistance 3  6 inch step up with opp knee drive x 10 6 inch step down with light touch x 15 4 inch retro step up x 15 right- single UE Leg press bilat 1 plates single leg, 2 plates for eccentric lowering on RLE  Single leg squat with arm reach to cone-needs  counter support 25# KB squat -tap to chair x 20  Wall slides 5 sec x 15 Prone quad stretch Supine hamstring stretch  Modalities: ice pack right knee x 15 minutes         PT Short Term Goals - 11/27/21 1025       PT SHORT TERM GOAL  #1   Title Pt will be I with HEP for Rt LE ROM and strength and allowed by protocol    Baseline 11/07/21 reports compliance, reviewed gait pattern as HEP    Period Weeks    Status Achieved    Target Date 10/22/21      PT SHORT TERM GOAL #2   Title Pt will be able to flex Rt knee to > 50 deg for progression of protocol and transfers    Baseline 11/07/21 86d flexion following AAROM heele slides    Period Weeks    Status Achieved    Target Date 10/22/21      PT SHORT TERM GOAL #3   Title Pt will be able to stand for ADLs with brace locked for no increased pain    Time 4    Period Weeks    Status Achieved      PT SHORT TERM GOAL #4   Title Pt will be able to report use of ice for swelling, pain up to 3-5 times per day    Baseline ices as needed    Time 4  Period Weeks    Status Achieved    Target Date 10/22/21               PT Long Term Goals - 12/31/21 1022       PT LONG TERM GOAL #1   Title Pt will be I with final HEP upon discharge for long term strength. 11/16/21: progression in HEP are added as indicated    Time 8    Period Weeks    Status On-going    Target Date 01/11/22      PT LONG TERM GOAL #2   Title Pt will be able to show Rt knee extension to at least 4/5 for maximal gait stability and transfers. 11/16/21: R knee and hip strength = 3+/5, pt able to complete SLR s ext lag. 12/10/21: 4/5    Time 8    Period Weeks    Status Achieved    Target Date 01/11/22      PT LONG TERM GOAL #3   Title Pt will demo Rt knee AROM 0- 120 deg for full ability to transfer and show optimal functional mobility. 11/14/21: Knee flexion =87d c hard endfeel    Baseline limited by protocol; 11/07/21 0-86d PROM; 12/06/21: 0-112 PROM; 12/10/21: achieved 0- 120 AROM at end of session    Time 8    Period Weeks    Status Achieved      PT LONG TERM GOAL #4   Title Pt will be able to walk in the community with LRAD and min limp, pain < 3/10 for short errands, distances.: Pt is current  walking c bledsoe brace opened to 90d. pt continues in Bledsoe brace per protocol    Baseline has not left her home before today since surgery; 11/07/21 Reviewed heel strike and cadence today: 11/15/22: using 1 crutch , no brace,improving gait pattern; 12/31/21: intermittent limping when stiff, betterwtih cues    Time 8    Period Weeks    Status Partially Met    Target Date 01/11/22      PT LONG TERM GOAL #5   Title Pt will be able to negotiate 6 or more stairs with 1 rail or AD reciprocally with good technique and knee control.    Time 8    Period Weeks    Status Achieved    Target Date 01/11/22                   Plan - 12/31/21 1146     Clinical Impression Statement [name] achieved 130 degrees right knee flexion and wants to achieve full ROM prior to surgery (150 left knee- pulling ankel to buttock). Focused knee and hip strengthening today with increased focus in single leg strength. Needs UE assist for single leg reach and forgot to work on this since it was assigned last week. She would benefi from a streamlined HEP to remove early exercises from HEP.    PT Treatment/Interventions ADLs/Self Care Home Management;Stair training;Therapeutic exercise;Passive range of motion;Manual techniques;Neuromuscular re-education;Therapeutic activities;Electrical Stimulation;Functional mobility training;Balance training;Patient/family education;Scar mobilization;Gait training;DME Instruction;Cryotherapy    PT Next Visit Plan closed chain strength and ROM . Protocol, progrssive step exercises, single leg    PT Home Exercise Plan Access Code: [facility]             Patient will benefit from skilled therapeutic intervention in order to improve the following deficits and impairments:  Abnormal gait, Decreased activity tolerance, Decreased endurance, Decreased knowledge of use of DME, Decreased range of motion,  Decreased skin integrity, Decreased strength, Increased fascial restricitons, Pain,  Postural dysfunction, Decreased mobility, Difficulty walking, Increased edema  Visit Diagnosis: S/P ORIF (open reduction internal fixation) fracture  Difficulty in walking, not elsewhere classified  Stiffness of right knee, not elsewhere classified     Problem List There are no problems to display for this patient.   Dorene Ar, [place] 12/31/2021, 12:17 PM  [place], [place], [address] Phone: [phone]   Fax:  [phone]  Name: SAMYRA LIMB MRN: [id number] Date of Birth: Sep 19, 1976

## 2022-01-02 ENCOUNTER — Ambulatory Visit: Payer: Medicaid Other | Admitting: Physical Therapy

## 2022-01-07 ENCOUNTER — Other Ambulatory Visit: Payer: Self-pay

## 2022-01-07 ENCOUNTER — Ambulatory Visit: Payer: Medicaid Other | Admitting: Physical Therapy

## 2022-01-07 DIAGNOSIS — S82034A Nondisplaced transverse fracture of right patella, initial encounter for closed fracture: Secondary | ICD-10-CM

## 2022-01-07 DIAGNOSIS — R262 Difficulty in walking, not elsewhere classified: Secondary | ICD-10-CM

## 2022-01-07 DIAGNOSIS — M25661 Stiffness of right knee, not elsewhere classified: Secondary | ICD-10-CM

## 2022-01-07 DIAGNOSIS — R600 Localized edema: Secondary | ICD-10-CM

## 2022-01-07 DIAGNOSIS — M6281 Muscle weakness (generalized): Secondary | ICD-10-CM

## 2022-01-07 DIAGNOSIS — Z9889 Other specified postprocedural states: Secondary | ICD-10-CM | POA: Diagnosis not present

## 2022-01-07 DIAGNOSIS — Z8781 Personal history of (healed) traumatic fracture: Secondary | ICD-10-CM

## 2022-01-07 NOTE — Therapy (Signed)
[place], [place], [address] Phone: [phone]   Fax:  [phone]  Physical Therapy Treatment  Patient Details  Name: Nichole Allen MRN: [id number] Date of Birth: 10-07-1976 Referring Provider (PT): Nichole Allen   Encounter Date: 01/07/2022   PT End of Session - 01/07/22 1021     Visit Number 23    Date for PT Re-Evaluation 01/11/22    Authorization Type [employer]- no auth for first 12 visits- completed for re-auth on 11/16/21    Authorization Time Period 11/20/21-01/19/22    Authorization - Visit Number 11    Authorization - Number of Visits 12    PT Start Time 1020    PT Stop Time 1120    PT Time Calculation (min) 60 min    Activity Tolerance Patient tolerated treatment well;No increased pain    Behavior During Therapy WFL for tasks assessed/performed             Past Medical History:  Diagnosis Date   Allergy    Anxiety    Depression     Past Surgical History:  Procedure Laterality Date   CESAREAN SECTION     NASAL SEPTUM SURGERY     ORIF PATELLA Right 09/19/2021   Procedure: OPEN REDUCTION INTERNAL (ORIF) FIXATION PATELLA;  Surgeon: Nichole Gash, MD;  Location: [facility];  Service: Orthopedics;  Laterality: Right;    There were no vitals filed for this visit.   Subjective Assessment - 01/07/22 1023     Subjective Knee is feeling good.  When at work it hurts if I'm standing too long (> 2 hours). And its more stiff than painful.  Not great at balancing yet.    Limitations Sitting;Lifting;House hold activities;Standing;Other (comment)    Patient Stated Goals Patient wants to be independent again    Currently in Pain? No/denies               [facility] Adult PT Treatment:                                                DATE: 01/07/22 Therapeutic Exercise: Recumbent bike 5 min L 2 Treadmill 2.0 mph and 2 % grade for 5 min  Forward step ups 10 lbs x 15 (Rt  LE) Lateral step ups Rt LE 10 lbs x 15  Reverse step ups ( 6 inch ) x 15 heavy cues needed SLS squat cone taps on chair x 2 min , done on each side for demo, then tried cone on floor (lower)  Wall sit 1 min 10 lbs isometric  Wall sit with alternating tibialis anterior 30 sec then heels 30 sec alternating  Sidelying green banded clam shells x 20 Sidelying hip abduction x 20 Banded bridging x 10 Banded bridge with clam  x 10 green TB SLR x 20 cues for slow eccentric SLR for VMO x 20 small ROM prop on elbows and small range pulse  Self Care: Focus and concentration for balance exercises   Knee ROM Rt 0-5-126 deg , may not be necessary to get to the same AROM as the Lt knee.  Cold pack 10 min R ant knee    PT Education - 01/07/22 1029     Education Details POC, stability and limitations after PT    Person(s) Educated Patient  Methods Explanation    Comprehension Verbalized understanding              PT Short Term Goals - 11/27/21 1025       PT SHORT TERM GOAL #1   Title Pt will be I with HEP for Rt LE ROM and strength and allowed by protocol    Baseline 11/07/21 reports compliance, reviewed gait pattern as HEP    Period Weeks    Status Achieved    Target Date 10/22/21      PT SHORT TERM GOAL #2   Title Pt will be able to flex Rt knee to > 50 deg for progression of protocol and transfers    Baseline 11/07/21 86d flexion following AAROM heele slides    Period Weeks    Status Achieved    Target Date 10/22/21      PT SHORT TERM GOAL #3   Title Pt will be able to stand for ADLs with brace locked for no increased pain    Time 4    Period Weeks    Status Achieved      PT SHORT TERM GOAL #4   Title Pt will be able to report use of ice for swelling, pain up to 3-5 times per day    Baseline ices as needed    Time 4    Period Weeks    Status Achieved    Target Date 10/22/21               PT Long Term Goals - 12/31/21 1022       PT LONG TERM GOAL #1   Title  Pt will be I with final HEP upon discharge for long term strength. 11/16/21: progression in HEP are added as indicated    Time 8    Period Weeks    Status On-going    Target Date 01/11/22      PT LONG TERM GOAL #2   Title Pt will be able to show Rt knee extension to at least 4/5 for maximal gait stability and transfers. 11/16/21: R knee and hip strength = 3+/5, pt able to complete SLR s ext lag. 12/10/21: 4/5    Time 8    Period Weeks    Status Achieved    Target Date 01/11/22      PT LONG TERM GOAL #3   Title Pt will demo Rt knee AROM 0- 120 deg for full ability to transfer and show optimal functional mobility. 11/14/21: Knee flexion =87d c hard endfeel    Baseline limited by protocol; 11/07/21 0-86d PROM; 12/06/21: 0-112 PROM; 12/10/21: achieved 0- 120 AROM at end of session    Time 8    Period Weeks    Status Achieved      PT LONG TERM GOAL #4   Title Pt will be able to walk in the community with LRAD and min limp, pain < 3/10 for short errands, distances.: Pt is current walking c bledsoe brace opened to 90d. pt continues in Bledsoe brace per protocol    Baseline has not left her home before today since surgery; 11/07/21 Reviewed heel strike and cadence today: 11/15/22: using 1 crutch , no brace,improving gait pattern; 12/31/21: intermittent limping when stiff, betterwtih cues    Time 8    Period Weeks    Status Partially Met    Target Date 01/11/22      PT LONG TERM GOAL #5   Title Pt will be able to negotiate 6  or more stairs with 1 rail or AD reciprocally with good technique and knee control.    Time 8    Period Weeks    Status Achieved    Target Date 01/11/22                   Plan - 01/07/22 1029     Clinical Impression Statement Nichole Allen did well tody without knee pain for the entire session. She has 1 more treatment then will be Re-evaluated to decide further POC.  She got to 126 deg of Rt knee flexion today, Lt. knee measured at 140 deg. She needs cues for focusing  when doing single leg balance exercise but was able to reach to the floor for cone taps with time and no LOB.  She walked on the treadmill for a short period of time without pain but did develop a minor limp after about 3 min. PT streamlined her HEP, asking her to stretch daily but divide her strengthening into 2 groups of about 5 exercises (mat vs standing) .    Examination-Activity Limitations Bathing;Carry;Lift;Stand;Sleep;Locomotion Level;Bend;Dressing;Reach Overhead;Squat;Caring for Others;Hygiene/Grooming;Stairs;Transfers;Toileting    Examination-Participation Restrictions Community Activity;Laundry;Occupation;Shop;Driving;Cleaning;Interpersonal Relationship;Meal Prep    Stability/Clinical Decision Making Stable/Uncomplicated    Rehab Potential Excellent    PT Frequency 2x / week    PT Duration 8 weeks    PT Treatment/Interventions ADLs/Self Care Home Management;Stair training;Therapeutic exercise;Passive range of motion;Manual techniques;Neuromuscular re-education;Therapeutic activities;Electrical Stimulation;Functional mobility training;Balance training;Patient/family education;Scar mobilization;Gait training;DME Instruction;Cryotherapy    PT Next Visit Plan closed chain strength and ROM . Protocol, progrssive step exercises, single leg    PT Home Exercise Plan Access Code: [email or web address]    Consulted and Agree with Plan of Care Patient             Patient will benefit from skilled therapeutic intervention in order to improve the following deficits and impairments:  Abnormal gait, Decreased activity tolerance, Decreased endurance, Decreased knowledge of use of DME, Decreased range of motion, Decreased skin integrity, Decreased strength, Increased fascial restricitons, Pain, Postural dysfunction, Decreased mobility, Difficulty walking, Increased edema  Visit Diagnosis: S/P ORIF (open reduction internal fixation) fracture  Difficulty in walking, not elsewhere classified  Stiffness of right  knee, not elsewhere classified  Nondisplaced transverse fracture of right patella, initial encounter for closed fracture  Muscle weakness (generalized)  Localized edema     Problem List There are no problems to display for this patient.   Nichole Allen, PT 01/07/2022, 11:23 AM  [facility] [address] [place], [place], [address] Phone: [phone]   Fax:  [phone]  Name: Nichole Allen MRN: [id number] Date of Birth: 1976-02-10  Raeford Razor, PT 01/07/22 11:23 AM Phone: [phone] Fax: [phone]

## 2022-01-14 ENCOUNTER — Ambulatory Visit: Payer: Medicaid Other | Admitting: Physical Therapy

## 2022-01-14 ENCOUNTER — Other Ambulatory Visit: Payer: Self-pay

## 2022-01-14 ENCOUNTER — Encounter: Payer: Self-pay | Admitting: Physical Therapy

## 2022-01-14 DIAGNOSIS — M25661 Stiffness of right knee, not elsewhere classified: Secondary | ICD-10-CM

## 2022-01-14 DIAGNOSIS — Z9889 Other specified postprocedural states: Secondary | ICD-10-CM

## 2022-01-14 DIAGNOSIS — Z8781 Personal history of (healed) traumatic fracture: Secondary | ICD-10-CM

## 2022-01-14 DIAGNOSIS — R262 Difficulty in walking, not elsewhere classified: Secondary | ICD-10-CM

## 2022-01-14 NOTE — Therapy (Signed)
[place], [place], [address] Phone: [phone]   Fax:  [phone]  Physical Therapy Treatment  Patient Details  Name: Nichole Allen MRN: [id number] Date of Birth: 1976/04/11 Referring Provider (PT): Dr. Ophelia Charter   Encounter Date: 01/14/2022   PT End of Session - 01/14/22 1102     Visit Number 24    Number of Visits 28    Date for PT Re-Evaluation 01/11/22    Authorization Type [employer]- no auth for first 12 visits- completed for re-auth on 11/16/21    Authorization Time Period 11/20/21-01/19/22    Authorization - Visit Number 12    Authorization - Number of Visits 12    PT Start Time 1055    PT Stop Time 1150    PT Time Calculation (min) 55 min             Past Medical History:  Diagnosis Date   Allergy    Anxiety    Depression     Past Surgical History:  Procedure Laterality Date   CESAREAN SECTION     NASAL SEPTUM SURGERY     ORIF PATELLA Right 09/19/2021   Procedure: OPEN REDUCTION INTERNAL (ORIF) FIXATION PATELLA;  Surgeon: Hiram Gash, MD;  Location: [place];  Service: Orthopedics;  Laterality: Right;    There were no vitals filed for this visit.   Subjective Assessment - 01/14/22 1100     Subjective I feel like I have difficulty coming down the stairs. I feel like I need to be stronger in the back of my leg.    Currently in Pain? No/denies                [facility] PT Assessment - 01/14/22 0001       PROM   Overall PROM Comments 133 right knee AROM hooklying             [facility] Adult PT Treatment:                                                DATE: 01/07/22 Therapeutic Exercise: Elliptical L3 R3 x 7 min 133 AROM knee flexion Lunge with knee at column x 15 each Stool scoot 100 feet Bosu step ups - 1 UE on counter  4 inch step  downs- mirror feedback to decrease hip drop-  [facility] taps- needs UE assist SLS on foam < 5 sec- unable to add  dynamic Hip hike on edge of step            PT Short Term Goals - 11/27/21 1025       PT SHORT TERM GOAL #1   Title Pt will be I with HEP for Rt LE ROM and strength and allowed by protocol    Baseline 11/07/21 reports compliance, reviewed gait pattern as HEP    Period Weeks    Status Achieved    Target Date 10/22/21      PT SHORT TERM GOAL #2   Title Pt will be able to flex Rt knee to > 50 deg for progression of protocol and transfers    Baseline 11/07/21 86d flexion following AAROM heele slides    Period Weeks    Status Achieved    Target Date 10/22/21      PT SHORT TERM GOAL #3  Title Pt will be able to stand for ADLs with brace locked for no increased pain    Time 4    Period Weeks    Status Achieved      PT SHORT TERM GOAL #4   Title Pt will be able to report use of ice for swelling, pain up to 3-5 times per day    Baseline ices as needed    Time 4    Period Weeks    Status Achieved    Target Date 10/22/21               PT Long Term Goals - 12/31/21 1022       PT LONG TERM GOAL #1   Title Pt will be I with final HEP upon discharge for long term strength. 11/16/21: progression in HEP are added as indicated    Time 8    Period Weeks    Status On-going    Target Date 01/11/22      PT LONG TERM GOAL #2   Title Pt will be able to show Rt knee extension to at least 4/5 for maximal gait stability and transfers. 11/16/21: R knee and hip strength = 3+/5, pt able to complete SLR s ext lag. 12/10/21: 4/5    Time 8    Period Weeks    Status Achieved    Target Date 01/11/22      PT LONG TERM GOAL #3   Title Pt will demo Rt knee AROM 0- 120 deg for full ability to transfer and show optimal functional mobility. 11/14/21: Knee flexion =87d c hard endfeel    Baseline limited by protocol; 11/07/21 0-86d PROM; 12/06/21: 0-112 PROM; 12/10/21: achieved 0- 120 AROM at end of session    Time 8    Period Weeks    Status Achieved      PT LONG TERM GOAL #4   Title Pt  will be able to walk in the community with LRAD and min limp, pain < 3/10 for short errands, distances.: Pt is current walking c bledsoe brace opened to 90d. pt continues in Bledsoe brace per protocol    Baseline has not left her home before today since surgery; 11/07/21 Reviewed heel strike and cadence today: 11/15/22: using 1 crutch , no brace,improving gait pattern; 12/31/21: intermittent limping when stiff, betterwtih cues    Time 8    Period Weeks    Status Partially Met    Target Date 01/11/22      PT LONG TERM GOAL #5   Title Pt will be able to negotiate 6 or more stairs with 1 rail or AD reciprocally with good technique and knee control.    Time 8    Period Weeks    Status Achieved    Target Date 01/11/22                   Plan - 01/14/22 1303     Clinical Impression Statement Nichole Allen needs cues, feedback and demonstration to perform step down without hip drop. Nichole Allen has dificulty with dynamic balance and unlevel surfaces. Her single leg strength including calf remains weak. Continued with lunges using doorway/wall to correct form. She tolerated the session with fatigue, no pain. Her PROM knee flexion was 136 and active 132.    PT Treatment/Interventions ADLs/Self Care Home Management;Stair training;Therapeutic exercise;Passive range of motion;Manual techniques;Neuromuscular re-education;Therapeutic activities;Electrical Stimulation;Functional mobility training;Balance training;Patient/family education;Scar mobilization;Gait training;DME Instruction;Cryotherapy    PT Next Visit Plan Recert next, needs to  add recert to cover  this visit.    PT Home Exercise Plan Access Code: [facility]             Patient will benefit from skilled therapeutic intervention in order to improve the following deficits and impairments:  Abnormal gait, Decreased activity tolerance, Decreased endurance, Decreased knowledge of use of DME, Decreased range of motion, Decreased skin integrity, Decreased  strength, Increased fascial restricitons, Pain, Postural dysfunction, Decreased mobility, Difficulty walking, Increased edema  Visit Diagnosis: S/P ORIF (open reduction internal fixation) fracture  Difficulty in walking, not elsewhere classified  Stiffness of right knee, not elsewhere classified     Problem List There are no problems to display for this patient.   Hessie Diener [place], [place] 01/14/2022, 1:06 PM  [facility] [address] [place], [place], [address] Phone: [phone]   Fax:  [phone]  Name: LEASIA SWANN MRN: [id number] Date of Birth: 1976/03/19

## 2022-01-21 ENCOUNTER — Encounter: Payer: Self-pay | Admitting: Physical Therapy

## 2022-01-21 ENCOUNTER — Other Ambulatory Visit: Payer: Self-pay

## 2022-01-21 ENCOUNTER — Ambulatory Visit: Payer: Medicaid Other | Attending: Orthopaedic Surgery | Admitting: Physical Therapy

## 2022-01-21 DIAGNOSIS — R262 Difficulty in walking, not elsewhere classified: Secondary | ICD-10-CM | POA: Diagnosis present

## 2022-01-21 DIAGNOSIS — Z9889 Other specified postprocedural states: Secondary | ICD-10-CM | POA: Diagnosis not present

## 2022-01-21 DIAGNOSIS — Z8781 Personal history of (healed) traumatic fracture: Secondary | ICD-10-CM | POA: Diagnosis present

## 2022-01-21 DIAGNOSIS — M25661 Stiffness of right knee, not elsewhere classified: Secondary | ICD-10-CM | POA: Diagnosis present

## 2022-01-21 DIAGNOSIS — S82034A Nondisplaced transverse fracture of right patella, initial encounter for closed fracture: Secondary | ICD-10-CM | POA: Insufficient documentation

## 2022-01-21 DIAGNOSIS — R600 Localized edema: Secondary | ICD-10-CM | POA: Insufficient documentation

## 2022-01-21 NOTE — Therapy (Addendum)
[facility] [address] [place], [place], [address] Phone: [phone]   Fax:  [phone]  Physical Therapy Treatment/ERO  Patient Details  Name: Nichole Allen MRN: [id number] Date of Birth: [birth year] Referring Provider (PT): Dr. Ramond Marrow   Encounter Date: 01/21/2022   PT End of Session - 01/21/22 1028     Visit Number 25    Number of Visits 37    Date for PT Re-Evaluation 03/04/22    Authorization Type [employer]- no auth for first 12 visits- completed for re-auth on 01/21/22    Authorization Time Period 11/20/21-01/19/22 submitted 01/21/22    PT Start Time 1025    PT Stop Time 1103    PT Time Calculation (min) 38 min    Activity Tolerance Patient tolerated treatment well;No increased pain    Behavior During Therapy WFL for tasks assessed/performed              Past Medical History:  Diagnosis Date   Allergy    Anxiety    Depression     Past Surgical History:  Procedure Laterality Date   CESAREAN SECTION     NASAL SEPTUM SURGERY     ORIF PATELLA Right 09/19/2021   Procedure: OPEN REDUCTION INTERNAL (ORIF) FIXATION PATELLA;  Surgeon: Bjorn Pippin, MD;  Location: [facility];  Service: Orthopedics;  Laterality: Right;    There were no vitals filed for this visit.   Subjective Assessment - 01/21/22 1210     Subjective My balance is not good.  I feel like I need more therapy.    Currently in Pain? No/denies                 [facility] PT Assessment - 01/21/22 0001       Strength   Right Hip Flexion 4-/5    Right Hip ABduction 4/5    Left Hip Flexion 5/5    Left Hip ABduction 4/5    Right Knee Flexion 5/5    Right Knee Extension 4-/5                 [facility] Adult PT Treatment:                                                DATE: 01/21/22 Therapeutic Exercise: Lunge in doorway, static hold for isometric quad, reduced knee pain  Single leg stance on foam Hip abduction focus on  level pelvis Banded glute bridge x 15  Added clam x 15 each side Quadruped donkey kick (hip extension ) with blue band (on forearms) x 15 SL clam blue band x 15  SL hydrant x 15 with band  SL hip abduction blue band x 10      PT Education - 01/21/22 1210     Education Details hip and glute activation, HEP modification    Person(s) Educated Patient    Methods Explanation;Demonstration    Comprehension Verbalized understanding;Returned demonstration              PT Short Term Goals - 11/27/21 1025       PT SHORT TERM GOAL #1   Title Pt will be I with HEP for Rt LE ROM and strength and allowed by protocol    Baseline 11/07/21 reports compliance, reviewed gait pattern as HEP    Period Weeks  Status Achieved    Target Date 10/22/21      PT SHORT TERM GOAL #2   Title Pt will be able to flex Rt knee to > 50 deg for progression of protocol and transfers    Baseline 11/07/21 86d flexion following AAROM heele slides    Period Weeks    Status Achieved    Target Date 10/22/21      PT SHORT TERM GOAL #3   Title Pt will be able to stand for ADLs with brace locked for no increased pain    Time 4    Period Weeks    Status Achieved      PT SHORT TERM GOAL #4   Title Pt will be able to report use of ice for swelling, pain up to 3-5 times per day    Baseline ices as needed    Time 4    Period Weeks    Status Achieved    Target Date 10/22/21               PT Long Term Goals - 01/21/22 1042       PT LONG TERM GOAL #1   Title Pt will be I with final HEP upon discharge for long term strength. 11/16/21: progression in HEP are added as indicated    Baseline in progress    Time 6    Period Weeks    Status On-going    Target Date 03/04/22      PT LONG TERM GOAL #2   Title Pt will be able to show Rt knee extension to at least 4/5 for maximal gait stability and transfers. 11/16/21: R knee and hip strength = 3+/5, pt able to complete SLR s ext lag. 12/10/21: 4/5     Baseline Rt knee ext 4/5    Time 6    Period Weeks    Status Achieved    Target Date 03/04/22      PT LONG TERM GOAL #3   Title Pt will demo Rt knee AROM 0- 120 deg for full ability to transfer and show optimal functional mobility. 11/14/21: Knee flexion =87d c hard endfeel    Baseline 0-130    Time 6    Period Weeks    Status Achieved      PT LONG TERM GOAL #4   Title Pt will be able to walk in the community with LRAD and min limp, pain < 3/10 for short errands, distances.: Pt is current walking c bledsoe brace opened to 90d. pt continues in Bledsoe brace per protocol    Baseline cont with intermittent limping end of the day or after therapy    Time 6    Period Weeks    Status On-going    Target Date 03/04/22      PT LONG TERM GOAL #5   Title Pt will be able to negotiate 6 or more stairs with 1 rail or AD reciprocally with good technique and knee control.    Baseline can do this with a rail but poor control without rail    Time 6    Period Weeks    Status On-going    Target Date 03/04/22      Additional Long Term Goals   Additional Long Term Goals Yes      PT LONG TERM GOAL #6   Title Pt will be able to stand on one leg for hip and knee exercises with level pelvis, abduction 4+/5 or better  Baseline unable to do this , hip drops due to weakness , hip abd 4/5    Time 6    Period Weeks    Status New    Target Date 03/04/22             Check all possible CPT codes: 31438- Therapeutic Exercise, [phone]- Neuro Re-education, [phone] - Gait Training, [phone] - Manual Therapy, 97530 - Therapeutic Activities, and 97535 - Self Care     If treatment provided at initial evaluation, no treatment charged due to lack of authorization.           Plan - 01/21/22 1029     Clinical Impression Statement Patient continues to demonstrate significant weakness in Rt LE, bilateral hips, lack of core awarness and control with functional mobiity and exercises. She has good AROM in Rt knee  0-132 deg flexion. Strength in quads is 4-/5 and hips 4/5 throughout.  She has poor knee control eccentrically.  She has difficulty feeling a neuromuscular connection to her glutes and lateral hip.  She is able to work 3 days a week with a chair as needed for when she has to.  Her pain is usually minimal to none.  the lunges do aggravte her knee, modifed this to be more static and to isometrically contract her quads.  She will benefit from continued therapy for a shift in focus to strength and contol in hips/core.    Examination-Activity Limitations Bathing;Carry;Lift;Stand;Sleep;Locomotion Level;Bend;Dressing;Reach Overhead;Squat;Caring for Others;Hygiene/Grooming;Stairs;Transfers;Toileting    Examination-Participation Restrictions Community Activity;Laundry;Occupation;Shop;Driving;Cleaning;Interpersonal Relationship;Meal Prep    Stability/Clinical Decision Making Stable/Uncomplicated    Clinical Decision Making Low    Rehab Potential Excellent    PT Frequency 2x / week    PT Duration 6 weeks    PT Treatment/Interventions ADLs/Self Care Home Management;Stair training;Therapeutic exercise;Passive range of motion;Manual techniques;Neuromuscular re-education;Therapeutic activities;Electrical Stimulation;Functional mobility training;Balance training;Patient/family education;Scar mobilization;Gait training;DME Instruction;Cryotherapy    PT Next Visit Plan cont hip, core and Rt quad in standing as tolerated. Attendion to level pelvis thorughout    PT Home Exercise Plan Access Code: [email or web address] added band to bridges and clam (has not been doing this)    Consulted and Agree with Plan of Care Patient              Patient will benefit from skilled therapeutic intervention in order to improve the following deficits and impairments:  Abnormal gait, Decreased activity tolerance, Decreased endurance, Decreased knowledge of use of DME, Decreased range of motion, Decreased skin integrity, Decreased strength,  Increased fascial restricitons, Pain, Postural dysfunction, Decreased mobility, Difficulty walking, Increased edema  Visit Diagnosis: S/P ORIF (open reduction internal fixation) fracture  Difficulty in walking, not elsewhere classified  Stiffness of right knee, not elsewhere classified  Nondisplaced transverse fracture of right patella, initial encounter for closed fracture  Localized edema     Problem List There are no problems to display for this patient.   Lillionna Nabi, PT 01/21/2022, 12:14 PM  [facility] [address] [place], [place], [address] Phone: [phone]   Fax:  [phone]  Name: DAZHANE VILLAGOMEZ MRN: [id number] Date of Birth: 1976/02/24   Karie Mainland, PT 01/21/22 12:17 PM Phone: [phone] Fax: [phone]

## 2022-01-28 ENCOUNTER — Ambulatory Visit: Payer: Medicaid Other | Admitting: Physical Therapy

## 2022-01-28 ENCOUNTER — Other Ambulatory Visit: Payer: Self-pay

## 2022-01-28 ENCOUNTER — Encounter: Payer: Self-pay | Admitting: Physical Therapy

## 2022-01-28 DIAGNOSIS — R262 Difficulty in walking, not elsewhere classified: Secondary | ICD-10-CM

## 2022-01-28 DIAGNOSIS — R600 Localized edema: Secondary | ICD-10-CM

## 2022-01-28 DIAGNOSIS — M25661 Stiffness of right knee, not elsewhere classified: Secondary | ICD-10-CM

## 2022-01-28 DIAGNOSIS — Z8781 Personal history of (healed) traumatic fracture: Secondary | ICD-10-CM

## 2022-01-28 DIAGNOSIS — Z9889 Other specified postprocedural states: Secondary | ICD-10-CM

## 2022-01-28 DIAGNOSIS — S82034A Nondisplaced transverse fracture of right patella, initial encounter for closed fracture: Secondary | ICD-10-CM

## 2022-01-28 NOTE — Therapy (Signed)
[place] [facility] ?[address] ?[place], [place], [address] ?Phone: [phone]   Fax:  [phone] ? ?Physical Therapy Treatment ? ?Patient Details  ?Name: Nichole Allen ?MRN: [id number] ?Date of Birth: 1976/08/08 ?Referring Provider (PT): Dr. Ramond Marrow ? ? ?Encounter Date: 01/28/2022 ? ? PT End of Session - 01/28/22 1324   ? ? Visit Number 26   ? Number of Visits 37   ? Date for PT Re-Evaluation 03/04/22   ? Authorization Type [employer]- no auth for first 12 visits- completed for re-auth on 01/21/22   ? Authorization Time Period 11/20/21-01/19/22 submitted 01/21/22   ? PT Start Time 1318   ? PT Stop Time 1400   ? PT Time Calculation (min) 42 min   ? ?  ?  ? ?  ? ? ?Past Medical History:  ?Diagnosis Date  ? Allergy   ? Anxiety   ? Depression   ? ? ?Past Surgical History:  ?Procedure Laterality Date  ? CESAREAN SECTION    ? NASAL SEPTUM SURGERY    ? ORIF PATELLA Right 09/19/2021  ? Procedure: OPEN REDUCTION INTERNAL (ORIF) FIXATION PATELLA;  Surgeon: Bjorn Pippin, MD;  Location: [facility];  Service: Orthopedics;  Laterality: Right;  ? ? ?There were no vitals filed for this visit. ? ? Subjective Assessment - 01/28/22 1322   ? ? Subjective No pain. I should have stretched first. I went to the gym twice and did the bicycle and hip machines and arms. My knee was buckling some friday.   ? Currently in Pain? No/denies   ? ?  ?  ? ?  ? ? ?[facility] Adult PT Treatment:                                                DATE: 01/28/22 ?Therapeutic Exercise: ?Stair Stepper L3 x 5 minutes ?Lunge in parallel bars right x 15  ?Single leg heel raise x 20 ? Gastroc stretch  ?Split squat 2 x10 -foot in chair- using bilat UE in parallel bars  ?Right single leg hip hinge(knee flexed x 10, knee extended x 10) ?15#  KB goblet squat 15 x 3  ?15# 1 x 10, 25# deadlift 2 x 10 ?SLS on foam with 3 way hip ?Table top holds 10 sec x 2 ?Table top holds with alt toe taps ? ? ? ? ? ? ? ? ? ? PT  Short Term Goals - 11/27/21 1025   ? ?  ? PT SHORT TERM GOAL #1  ? Title Pt will be I with HEP for Rt LE ROM and strength and allowed by protocol   ? Baseline 11/07/21 reports compliance, reviewed gait pattern as HEP   ? Period Weeks   ? Status Achieved   ? Target Date 10/22/21   ?  ? PT SHORT TERM GOAL #2  ? Title Pt will be able to flex Rt knee to > 50 deg for progression of protocol and transfers   ? Baseline 11/07/21 86d flexion following AAROM heele slides   ? Period Weeks   ? Status Achieved   ? Target Date 10/22/21   ?  ? PT SHORT TERM GOAL #3  ? Title Pt will be able to stand for ADLs with brace locked for no increased pain   ? Time 4   ?  Period Weeks   ? Status Achieved   ?  ? PT SHORT TERM GOAL #4  ? Title Pt will be able to report use of ice for swelling, pain up to 3-5 times per day   ? Baseline ices as needed   ? Time 4   ? Period Weeks   ? Status Achieved   ? Target Date 10/22/21   ? ?  ?  ? ?  ? ? ? ? PT Long Term Goals - 01/21/22 1042   ? ?  ? PT LONG TERM GOAL #1  ? Title Pt will be I with final HEP upon discharge for long term strength. 11/16/21: progression in HEP are added as indicated   ? Baseline in progress   ? Time 6   ? Period Weeks   ? Status On-going   ? Target Date 03/04/22   ?  ? PT LONG TERM GOAL #2  ? Title Pt will be able to show Rt knee extension to at least 4/5 for maximal gait stability and transfers. 11/16/21: R knee and hip strength = 3+/5, pt able to complete SLR s ext lag. 12/10/21: 4/5   ? Baseline Rt knee ext 4/5   ? Time 6   ? Period Weeks   ? Status Achieved   ? Target Date 03/04/22   ?  ? PT LONG TERM GOAL #3  ? Title Pt will demo Rt knee AROM 0- 120 deg for full ability to transfer and show optimal functional mobility. 11/14/21: Knee flexion =87d c hard endfeel   ? Baseline 0-130   ? Time 6   ? Period Weeks   ? Status Achieved   ?  ? PT LONG TERM GOAL #4  ? Title Pt will be able to walk in the community with LRAD and min limp, pain < 3/10 for short errands, distances.: Pt  is current walking c bledsoe brace opened to 90d. pt continues in Bledsoe brace per protocol   ? Baseline cont with intermittent limping end of the day or after therapy   ? Time 6   ? Period Weeks   ? Status On-going   ? Target Date 03/04/22   ?  ? PT LONG TERM GOAL #5  ? Title Pt will be able to negotiate 6 or more stairs with 1 rail or AD reciprocally with good technique and knee control.   ? Baseline can do this with a rail but poor control without rail   ? Time 6   ? Period Weeks   ? Status On-going   ? Target Date 03/04/22   ?  ? Additional Long Term Goals  ? Additional Long Term Goals Yes   ?  ? PT LONG TERM GOAL #6  ? Title Pt will be able to stand on one leg for hip and knee exercises with level pelvis, abduction 4+/5 or better   ? Baseline unable to do this , hip drops due to weakness , hip abd 4/5   ? Time 6   ? Period Weeks   ? Status New   ? Target Date 03/04/22   ? ?  ?  ? ?  ? ? ? ? ? ? ? ? Plan - 01/28/22 1359   ? ? Clinical Impression Statement Pt arrives reporting independent gym workouts last week. Focused knee strength and SL stability, core strength. Pt demonstrates improved dynamic single leg balance today. Worked on supine abdominal strengthening. Worked with weighted squats and deadlifts. Pt  tolerated session well.   ? PT Treatment/Interventions ADLs/Self Care Home Management;Stair training;Therapeutic exercise;Passive range of motion;Manual techniques;Neuromuscular re-education;Therapeutic activities;Electrical Stimulation;Functional mobility training;Balance training;Patient/family education;Scar mobilization;Gait training;DME Instruction;Cryotherapy   ? PT Next Visit Plan cont hip, core and Rt quad in standing as tolerated. Attendion to level pelvis thorughout   ? PT Home Exercise Plan Access Code: [email or web address] added band to bridges and clam (has not been doing this)   ? ?  ?  ? ?  ? ? ?Patient will benefit from skilled therapeutic intervention in order to improve the following deficits and  impairments:  Abnormal gait, Decreased activity tolerance, Decreased endurance, Decreased knowledge of use of DME, Decreased range of motion, Decreased skin integrity, Decreased strength, Increased fascial restricitons, Pain, Postural dysfunction, Decreased mobility, Difficulty walking, Increased edema ? ?Visit Diagnosis: ?S/P ORIF (open reduction internal fixation) fracture ? ?Difficulty in walking, not elsewhere classified ? ?Stiffness of right knee, not elsewhere classified ? ?Nondisplaced transverse fracture of right patella, initial encounter for closed fracture ? ?Localized edema ? ? ? ? ?Problem List ?There are no problems to display for this patient. ? ? ?Sherrie Mustache, PTA ?01/28/2022, 2:00 PM ? ?[place] [facility] ?[address] ?[place], [place], [address] ?Phone: [phone]   Fax:  [phone] ? ?Name: Nichole Allen ?MRN: [id number] ?Date of Birth: 25-Aug-1976 ? ? ? ?

## 2022-01-31 ENCOUNTER — Other Ambulatory Visit: Payer: Self-pay

## 2022-01-31 ENCOUNTER — Encounter: Payer: Self-pay | Admitting: Physical Therapy

## 2022-01-31 ENCOUNTER — Ambulatory Visit: Payer: Medicaid Other | Admitting: Physical Therapy

## 2022-01-31 DIAGNOSIS — Z9889 Other specified postprocedural states: Secondary | ICD-10-CM | POA: Diagnosis not present

## 2022-01-31 NOTE — Therapy (Signed)
[place] ?[facility] ?[address] ?[place], [place], [address] ?Phone: [phone]   Fax:  [phone] ? ?Physical Therapy Treatment ? ?Patient Details  ?Name: Nichole Allen ?MRN: [id number] ?Date of Birth: 03-19-1976 ?Referring Provider (PT): Dr. Ophelia Charter ? ? ?Encounter Date: 01/31/2022 ? ? PT End of Session - 01/31/22 1320   ? ? Visit Number 27   ? Number of Visits 37   ? Date for PT Re-Evaluation 03/04/22   ? Authorization Type [employer]- no auth for first 12 visits- completed for re-auth on 01/21/22   ? Authorization Time Period 11/20/21-01/19/22 submitted 01/21/22   ? PT Start Time 1315   ? PT Stop Time 1400   ? PT Time Calculation (min) 45 min   ? ?  ?  ? ?  ? ? ?Past Medical History:  ?Diagnosis Date  ? Allergy   ? Anxiety   ? Depression   ? ? ?Past Surgical History:  ?Procedure Laterality Date  ? CESAREAN SECTION    ? NASAL SEPTUM SURGERY    ? ORIF PATELLA Right 09/19/2021  ? Procedure: OPEN REDUCTION INTERNAL (ORIF) FIXATION PATELLA;  Surgeon: Hiram Gash, MD;  Location: [facility];  Service: Orthopedics;  Laterality: Right;  ? ? ?There were no vitals filed for this visit. ? ? Subjective Assessment - 01/31/22 1319   ? ? Subjective Nichole Allen reports she was very sore after last visit in muscles but also was sore in anterior knee.   ? Currently in Pain? No/denies   ? ?  ?  ? ?  ? ?[place] [facility] [place] Adult PT Treatment:                                                DATE: 01/31/22 ?Therapeutic Exercise: ?Stair Stepper L3 x 5 minutes ?Lunge in parallel bars right x 15 , left x 15 - mod cues /demo ?Single leg heel raise x 20 ? Gastroc stretch  ?Split squat 2 x10 -foot in chair- using bilat UE in parallel bars  ?Right single leg hip hinge(knee flexed x 10, knee extended x 10) ?15#  KB goblet squat 15 x 3  ?25# deadlift 2 x 10 ?SLS on foam with 3 way hip ?4 inch step taps with 1 UE  ?Single leg squat x 10-reach out with LLE ?Prone quad stretch with strap  x 3   ? ? ? ? ? ? ? ? PT Short Term Goals - 11/27/21 1025   ? ?  ? PT SHORT TERM GOAL #1  ? Title Pt will be I with HEP for Rt LE ROM and strength and allowed by protocol   ? Baseline 11/07/21 reports compliance, reviewed gait pattern as HEP   ? Period Weeks   ? Status Achieved   ? Target Date 10/22/21   ?  ? PT SHORT TERM GOAL #2  ? Title Pt will be able to flex Rt knee to > 50 deg for progression of protocol and transfers   ? Baseline 11/07/21 86d flexion following AAROM heele slides   ? Period Weeks   ? Status Achieved   ? Target Date 10/22/21   ?  ? PT SHORT TERM GOAL #3  ? Title Pt will be able to stand for ADLs with brace locked for no increased pain   ? Time 4   ?  Period Weeks   ? Status Achieved   ?  ? PT SHORT TERM GOAL #4  ? Title Pt will be able to report use of ice for swelling, pain up to 3-5 times per day   ? Baseline ices as needed   ? Time 4   ? Period Weeks   ? Status Achieved   ? Target Date 10/22/21   ? ?  ?  ? ?  ? ? ? ? PT Long Term Goals - 01/31/22 1321   ? ?  ? PT LONG TERM GOAL #1  ? Title Pt will be I with final HEP upon discharge for long term strength. 11/16/21: progression in HEP are added as indicated   ? Baseline in progress   ? Time 6   ? Target Date 03/04/22   ?  ? PT LONG TERM GOAL #2  ? Title Pt will be able to show Rt knee extension to at least 4/5 for maximal gait stability and transfers. 11/16/21: R knee and hip strength = 3+/5, pt able to complete SLR s ext lag. 12/10/21: 4/5   ? Baseline Rt knee ext 4/5   ? Time 6   ? Period Weeks   ? Status Achieved   ? Target Date 03/04/22   ?  ? PT LONG TERM GOAL #3  ? Title Pt will demo Rt knee AROM 0- 120 deg for full ability to transfer and show optimal functional mobility. 11/14/21: Knee flexion =87d c hard endfeel   ? Baseline 0-130   ? Time 6   ? Period Weeks   ? Status Achieved   ?  ? PT LONG TERM GOAL #4  ? Title Pt will be able to walk in the community with LRAD and min limp, pain < 3/10 for short errands, distances.: Pt is current  walking c bledsoe brace opened to 90d. pt continues in Bledsoe brace per protocol   ? Baseline cont with intermittent limping end of the day or after therapy   ? Time 6   ? Period Weeks   ? Status On-going   ? Target Date 03/04/22   ?  ? PT LONG TERM GOAL #5  ? Title Pt will be able to negotiate 6 or more stairs with 1 rail or AD reciprocally with good technique and knee control.   ? Baseline can do this with a rail but poor control without rail   ? Time 6   ? Period Weeks   ? Status On-going   ? Target Date 03/04/22   ?  ? PT LONG TERM GOAL #6  ? Title Pt will be able to stand on one leg for hip and knee exercises with level pelvis, abduction 4+/5 or better   ? Time 6   ? Period Weeks   ? Status On-going   ? Target Date 03/04/22   ? ?  ?  ? ?  ? ? ? ? ? ? ? ? Plan - 01/31/22 1353   ? ? Clinical Impression Statement Pt reports considerable soreness in thighs and hips after last session as well as anterior knee soreness. Continued with same intensity therex and saved time to stretch quad at end of session as well as ice pack to decrease post workout soreness. Mod cues continues to be required for correct form and considerable UE use required for lunge/ SL squat exercises.   ? PT Treatment/Interventions ADLs/Self Care Home Management;Stair training;Therapeutic exercise;Passive range of motion;Manual techniques;Neuromuscular re-education;Therapeutic activities;Electrical Stimulation;Functional mobility  training;Balance training;Patient/family education;Scar mobilization;Gait training;DME Instruction;Cryotherapy   ? PT Next Visit Plan cont hip, core and Rt quad in standing as tolerated. Attendion to level pelvis thorughout   ? PT Home Exercise Plan Access Code: [email or web address] added band to bridges and clam (has not been doing this)   ? Consulted and Agree with Plan of Care Patient   ? ?  ?  ? ?  ? ? ?Patient will benefit from skilled therapeutic intervention in order to improve the following deficits and impairments:   Abnormal gait, Decreased activity tolerance, Decreased endurance, Decreased knowledge of use of DME, Decreased range of motion, Decreased skin integrity, Decreased strength, Increased fascial restricitons, Pain, Postural dysfunction, Decreased mobility, Difficulty walking, Increased edema ? ?Visit Diagnosis: ?S/P ORIF (open reduction internal fixation) fracture ? ?Difficulty in walking, not elsewhere classified ? ?Stiffness of right knee, not elsewhere classified ? ? ? ? ?Problem List ?There are no problems to display for this patient. ? ? ?Dorene Ar, PTA ?01/31/2022, 2:03 PM ? ?[place] ?[facility] ?[address] ?[place], [place], [address] ?Phone: [phone]   Fax:  [phone] ? ?Name: Nichole Allen ?MRN: [id number] ?Date of Birth: 08/09/1976 ? ? ? ?

## 2022-02-04 ENCOUNTER — Other Ambulatory Visit: Payer: Self-pay

## 2022-02-04 ENCOUNTER — Ambulatory Visit: Payer: Medicaid Other | Admitting: Physical Therapy

## 2022-02-04 ENCOUNTER — Encounter: Payer: Self-pay | Admitting: Physical Therapy

## 2022-02-04 DIAGNOSIS — R262 Difficulty in walking, not elsewhere classified: Secondary | ICD-10-CM

## 2022-02-04 DIAGNOSIS — Z9889 Other specified postprocedural states: Secondary | ICD-10-CM

## 2022-02-04 DIAGNOSIS — M25661 Stiffness of right knee, not elsewhere classified: Secondary | ICD-10-CM

## 2022-02-04 NOTE — Therapy (Signed)
[facility] ?[facility] ?[address] ?[place], [place], [address] ?Phone: [phone]   Fax:  [phone] ? ?Physical Therapy Treatment ? ?Patient Details  ?Name: Nichole Allen ?MRN: [id number] ?Date of Birth: 1976-08-30 ?Referring Provider (PT): Dr. Ramond Marrow ? ? ?Encounter Date: 02/04/2022 ? ? PT End of Session - 02/04/22 1324   ? ? Visit Number 28   ? Number of Visits 37   ? Date for PT Re-Evaluation 03/04/22   ? Authorization Type [employer]- no auth for first 12 visits- completed for re-auth on 01/21/22   ? Authorization Time Period 11/20/21-01/19/22 submitted 01/21/22   ? PT Start Time 1321   ? PT Stop Time 1400   ? PT Time Calculation (min) 39 min   ? ?  ?  ? ?  ? ? ?Past Medical History:  ?Diagnosis Date  ? Allergy   ? Anxiety   ? Depression   ? ? ?Past Surgical History:  ?Procedure Laterality Date  ? CESAREAN SECTION    ? NASAL SEPTUM SURGERY    ? ORIF PATELLA Right 09/19/2021  ? Procedure: OPEN REDUCTION INTERNAL (ORIF) FIXATION PATELLA;  Surgeon: Bjorn Pippin, MD;  Location: [facility];  Service: Orthopedics;  Laterality: Right;  ? ? ?There were no vitals filed for this visit. ? ? Subjective Assessment - 02/04/22 1323   ? ? Subjective Nichole Allen reports tightness in the knee. I was not quite as sore.   ? Currently in Pain? No/denies   ? ?  ?  ? ?  ? ? ?[facility] Adult PT Treatment:                                                DATE: 01/31/22 ?Therapeutic Exercise: ?Stair Stepper L3 x 5 minutes ?Dead bug leg ext 3 rounds ?Prone plank on elbows 10 sec x3 ?Sit-stand RLE back 10# at chest 3 x 10 ?Step up (6 inch plus airex) with opp knee drive without UE x 15 ?Heel strike off airex 10 x 2 cues for form ?Leg press 60# x15, 80# x 15 , rihgt LE x 20# x 7  ?Hands on wall -foot work for beginner plyo ?Small range fast feet for beginner plyo ?S/L blue clam x 15 each ? ? ? ? ? ? ? ? ? ? ? ? ? ? PT Short Term Goals - 11/27/21 1025   ? ?  ? PT SHORT TERM GOAL #1  ? Title Pt  will be I with HEP for Rt LE ROM and strength and allowed by protocol   ? Baseline 11/07/21 reports compliance, reviewed gait pattern as HEP   ? Period Weeks   ? Status Achieved   ? Target Date 10/22/21   ?  ? PT SHORT TERM GOAL #2  ? Title Pt will be able to flex Rt knee to > 50 deg for progression of protocol and transfers   ? Baseline 11/07/21 86d flexion following AAROM heele slides   ? Period Weeks   ? Status Achieved   ? Target Date 10/22/21   ?  ? PT SHORT TERM GOAL #3  ? Title Pt will be able to stand for ADLs with brace locked for no increased pain   ? Time 4   ? Period Weeks   ? Status Achieved   ?  ? PT  SHORT TERM GOAL #4  ? Title Pt will be able to report use of ice for swelling, pain up to 3-5 times per day   ? Baseline ices as needed   ? Time 4   ? Period Weeks   ? Status Achieved   ? Target Date 10/22/21   ? ?  ?  ? ?  ? ? ? ? PT Long Term Goals - 01/31/22 1321   ? ?  ? PT LONG TERM GOAL #1  ? Title Pt will be I with final HEP upon discharge for long term strength. 11/16/21: progression in HEP are added as indicated   ? Baseline in progress   ? Time 6   ? Target Date 03/04/22   ?  ? PT LONG TERM GOAL #2  ? Title Pt will be able to show Rt knee extension to at least 4/5 for maximal gait stability and transfers. 11/16/21: R knee and hip strength = 3+/5, pt able to complete SLR s ext lag. 12/10/21: 4/5   ? Baseline Rt knee ext 4/5   ? Time 6   ? Period Weeks   ? Status Achieved   ? Target Date 03/04/22   ?  ? PT LONG TERM GOAL #3  ? Title Pt will demo Rt knee AROM 0- 120 deg for full ability to transfer and show optimal functional mobility. 11/14/21: Knee flexion =87d c hard endfeel   ? Baseline 0-130   ? Time 6   ? Period Weeks   ? Status Achieved   ?  ? PT LONG TERM GOAL #4  ? Title Pt will be able to walk in the community with LRAD and min limp, pain < 3/10 for short errands, distances.: Pt is current walking c bledsoe brace opened to 90d. pt continues in Bledsoe brace per protocol   ? Baseline cont  with intermittent limping end of the day or after therapy   ? Time 6   ? Period Weeks   ? Status On-going   ? Target Date 03/04/22   ?  ? PT LONG TERM GOAL #5  ? Title Pt will be able to negotiate 6 or more stairs with 1 rail or AD reciprocally with good technique and knee control.   ? Baseline can do this with a rail but poor control without rail   ? Time 6   ? Period Weeks   ? Status On-going   ? Target Date 03/04/22   ?  ? PT LONG TERM GOAL #6  ? Title Pt will be able to stand on one leg for hip and knee exercises with level pelvis, abduction 4+/5 or better   ? Time 6   ? Period Weeks   ? Status On-going   ? Target Date 03/04/22   ? ?  ?  ? ?  ? ? ? ? ? ? ? ? Plan - 02/04/22 1325   ? ? Clinical Impression Statement Nichole Allen reports she was not quite as sore last visit and has some tightness in knee on arrival. Continued with bilat and single leg knee/ hp strength and stability. Reviewed core exercises and added to pronted HEP. Nichole Allen demonstrated improved  stability and eccentric control today with step downs. Began light plyo today for progression of protocol.   ? PT Treatment/Interventions ADLs/Self Care Home Management;Stair training;Therapeutic exercise;Passive range of motion;Manual techniques;Neuromuscular re-education;Therapeutic activities;Electrical Stimulation;Functional mobility training;Balance training;Patient/family education;Scar mobilization;Gait training;DME Instruction;Cryotherapy   ? PT Next Visit Plan cont hip, core and  Rt quad in standing as tolerated. Attendion to level pelvis thorughout, check for insurance auth   ? PT Home Exercise Plan Access Code: [email or web address] added band to bridges and clam (has not been doing this)   ? Family Member Consulted young lady (daughter?)   ? ?  ?  ? ?  ? ? ?Patient will benefit from skilled therapeutic intervention in order to improve the following deficits and impairments:  Abnormal gait, Decreased activity tolerance, Decreased endurance, Decreased knowledge of use  of DME, Decreased range of motion, Decreased skin integrity, Decreased strength, Increased fascial restricitons, Pain, Postural dysfunction, Decreased mobility, Difficulty walking, Increased edema ? ?Visit Diagnosis: ?S/P ORIF (open reduction internal fixation) fracture ? ?Difficulty in walking, not elsewhere classified ? ?Stiffness of right knee, not elsewhere classified ? ? ? ? ?Problem List ?There are no problems to display for this patient. ? ? ?Sherrie Mustacheonoho, Shadee Rathod McGee, PTA ?02/04/2022, 2:09 PM ? ?[facility] ?[facility] ?[address] ?[place], [place], [address] ?Phone: [phone]   Fax:  [phone] ? ?Name: Nichole Allen ?MRN: [id number] ?Date of Birth: 1976-09-17 ? ? ? ?

## 2022-02-07 ENCOUNTER — Encounter: Payer: Self-pay | Admitting: Physical Therapy

## 2022-02-07 ENCOUNTER — Ambulatory Visit: Payer: Medicaid Other | Admitting: Physical Therapy

## 2022-02-07 ENCOUNTER — Other Ambulatory Visit: Payer: Self-pay

## 2022-02-07 DIAGNOSIS — Z9889 Other specified postprocedural states: Secondary | ICD-10-CM | POA: Diagnosis not present

## 2022-02-07 DIAGNOSIS — R262 Difficulty in walking, not elsewhere classified: Secondary | ICD-10-CM

## 2022-02-07 DIAGNOSIS — M25661 Stiffness of right knee, not elsewhere classified: Secondary | ICD-10-CM

## 2022-02-07 NOTE — Therapy (Signed)
[facility] ?[facility] ?[address] ?[place], [place], [address] ?Phone: [phone]   Fax:  [phone] ? ?Physical Therapy Treatment ? ?Patient Details  ?Name: Nichole Allen ?MRN: [id number] ?Date of Birth: August 06, 1976 ?Referring Provider (PT): Dr. Ramond Marrow ? ? ?Encounter Date: 02/07/2022 ? ? PT End of Session - 02/07/22 1151   ? ? Visit Number 28   ? Number of Visits 37   ? Date for PT Re-Evaluation 03/04/22   ? Authorization Type [employer]- no auth for first 12 visits- completed for re-auth on 01/21/22   ? Authorization Time Period 11/20/21-01/19/22 submitted 01/21/22; approved for 12 visits 01/22/22-03/08/22   ? Authorization - Visit Number 4   ? Authorization - Number of Visits 12   ? PT Start Time 1145   ? PT Stop Time 1230   ? PT Time Calculation (min) 45 min   ? ?  ?  ? ?  ? ? ?Past Medical History:  ?Diagnosis Date  ? Allergy   ? Anxiety   ? Depression   ? ? ?Past Surgical History:  ?Procedure Laterality Date  ? CESAREAN SECTION    ? NASAL SEPTUM SURGERY    ? ORIF PATELLA Right 09/19/2021  ? Procedure: OPEN REDUCTION INTERNAL (ORIF) FIXATION PATELLA;  Surgeon: Bjorn Pippin, MD;  Location: [facility];  Service: Orthopedics;  Laterality: Right;  ? ? ?There were no vitals filed for this visit. ? ? Subjective Assessment - 02/07/22 1150   ? ? Subjective Not tight today. Feeling good. I was not as sore after last time.   ? Currently in Pain? No/denies   ? ?  ?  ? ?  ? ? ? ? ?[facility] Adult PT Treatment:                                                DATE: 01/31/22 ?Therapeutic Exercise: ?Stair Stepper L3 x 5 minutes ?Front static lunge with 1 UE support 10 x 2  ?Dead bug leg ext 3 rounds ?Prone plank on elbows 15 sec x3 ?Sit-stand RLE back 10# at chest 3 x 10 ?Step up (6 inch plus airex) with opp knee drive without UE 10 x 2 ?Heel strike off airex 10 x 2 cues for form ?Leg press 80# 2 x 10 , rihgt LE x 20# x 7  ?Hands on wall -foot work for beginner plyo-fast  feet ?Squat with hop x5  ?S/L blue clam x 15 each x 2 , hip abduction with blue band at knees x 15 each  ? ? ? ? ? PT Short Term Goals - 11/27/21 1025   ? ?  ? PT SHORT TERM GOAL #1  ? Title Pt will be I with HEP for Rt LE ROM and strength and allowed by protocol   ? Baseline 11/07/21 reports compliance, reviewed gait pattern as HEP   ? Period Weeks   ? Status Achieved   ? Target Date 10/22/21   ?  ? PT SHORT TERM GOAL #2  ? Title Pt will be able to flex Rt knee to > 50 deg for progression of protocol and transfers   ? Baseline 11/07/21 86d flexion following AAROM heele slides   ? Period Weeks   ? Status Achieved   ? Target Date 10/22/21   ?  ? PT SHORT TERM GOAL #  3  ? Title Pt will be able to stand for ADLs with brace locked for no increased pain   ? Time 4   ? Period Weeks   ? Status Achieved   ?  ? PT SHORT TERM GOAL #4  ? Title Pt will be able to report use of ice for swelling, pain up to 3-5 times per day   ? Baseline ices as needed   ? Time 4   ? Period Weeks   ? Status Achieved   ? Target Date 10/22/21   ? ?  ?  ? ?  ? ? ? ? PT Long Term Goals - 01/31/22 1321   ? ?  ? PT LONG TERM GOAL #1  ? Title Pt will be I with final HEP upon discharge for long term strength. 11/16/21: progression in HEP are added as indicated   ? Baseline in progress   ? Time 6   ? Target Date 03/04/22   ?  ? PT LONG TERM GOAL #2  ? Title Pt will be able to show Rt knee extension to at least 4/5 for maximal gait stability and transfers. 11/16/21: R knee and hip strength = 3+/5, pt able to complete SLR s ext lag. 12/10/21: 4/5   ? Baseline Rt knee ext 4/5   ? Time 6   ? Period Weeks   ? Status Achieved   ? Target Date 03/04/22   ?  ? PT LONG TERM GOAL #3  ? Title Pt will demo Rt knee AROM 0- 120 deg for full ability to transfer and show optimal functional mobility. 11/14/21: Knee flexion =87d c hard endfeel   ? Baseline 0-130   ? Time 6   ? Period Weeks   ? Status Achieved   ?  ? PT LONG TERM GOAL #4  ? Title Pt will be able to walk in the  community with LRAD and min limp, pain < 3/10 for short errands, distances.: Pt is current walking c bledsoe brace opened to 90d. pt continues in Bledsoe brace per protocol   ? Baseline cont with intermittent limping end of the day or after therapy   ? Time 6   ? Period Weeks   ? Status On-going   ? Target Date 03/04/22   ?  ? PT LONG TERM GOAL #5  ? Title Pt will be able to negotiate 6 or more stairs with 1 rail or AD reciprocally with good technique and knee control.   ? Baseline can do this with a rail but poor control without rail   ? Time 6   ? Period Weeks   ? Status On-going   ? Target Date 03/04/22   ?  ? PT LONG TERM GOAL #6  ? Title Pt will be able to stand on one leg for hip and knee exercises with level pelvis, abduction 4+/5 or better   ? Time 6   ? Period Weeks   ? Status On-going   ? Target Date 03/04/22   ? ?  ?  ? ?  ? ? ? ? ? ? ? ? Plan - 02/07/22 1243   ? ? Clinical Impression Statement Cacey reports continued improvement in DOMS after her PT sessions. Continued with core and progression to hopping. She is demonstrating improved eccentric quad control and hip control with step down. She is still a little hesitannt wiht descending stairs without UE. Continues to need cues for correct technique to prevent injury with single  leg strength and return to  plyo.   ? PT Treatment/Interventions ADLs/Self Care Home Management;Stair training;Therapeutic exercise;Passive range of motion;Manual techniques;Neuromuscular re-education;Therapeutic activities;Electrical Stimulation;Functional mobility training;Balance training;Patient/family education;Scar mobilization;Gait training;DME Instruction;Cryotherapy   ? PT Next Visit Plan cont hip, core and Rt quad in standing as tolerated. Attendion to level pelvis thorughout, check for insurance auth   ? PT Home Exercise Plan Access Code: [email or web address] added band to bridges and clam (has not been doing this)   ? Consulted and Agree with Plan of Care Patient   ? ?  ?  ? ?   ? ? ?Patient will benefit from skilled therapeutic intervention in order to improve the following deficits and impairments:  Abnormal gait, Decreased activity tolerance, Decreased endurance, Decreased knowledge of use of DME, Decreased range of motion, Decreased skin integrity, Decreased strength, Increased fascial restricitons, Pain, Postural dysfunction, Decreased mobility, Difficulty walking, Increased edema ? ?Visit Diagnosis: ?S/P ORIF (open reduction internal fixation) fracture ? ?Difficulty in walking, not elsewhere classified ? ?Stiffness of right knee, not elsewhere classified ? ? ? ? ?Problem List ?There are no problems to display for this patient. ? ? ?Sherrie Mustacheonoho, Desman Polak McGee, PTA ?02/07/2022, 12:47 PM ? ?[place] ?[facility] ?[address] ?[place], [place], [address] ?Phone: [phone]   Fax:  [phone] ? ?Name: Vivia BirminghamKelly S Wimer ?MRN: [id number] ?Date of Birth: 09-05-1976 ? ? ? ?

## 2022-02-11 ENCOUNTER — Other Ambulatory Visit: Payer: Self-pay

## 2022-02-11 ENCOUNTER — Encounter: Payer: Self-pay | Admitting: Physical Therapy

## 2022-02-11 ENCOUNTER — Ambulatory Visit: Payer: Medicaid Other | Admitting: Physical Therapy

## 2022-02-11 DIAGNOSIS — Z9889 Other specified postprocedural states: Secondary | ICD-10-CM | POA: Diagnosis not present

## 2022-02-11 DIAGNOSIS — Z8781 Personal history of (healed) traumatic fracture: Secondary | ICD-10-CM

## 2022-02-11 DIAGNOSIS — R262 Difficulty in walking, not elsewhere classified: Secondary | ICD-10-CM

## 2022-02-11 DIAGNOSIS — M25661 Stiffness of right knee, not elsewhere classified: Secondary | ICD-10-CM

## 2022-02-11 NOTE — Therapy (Signed)
[place] ?[facility] ?[address] ?[place], [place], [address] ?Phone: [phone]   Fax:  [phone] ? ?Physical Therapy Treatment ? ?Patient Details  ?Name: [place] ?MRN: [id number] ?Date of Birth: 01/10/1976 ?Referring Provider (PT): Dr. Ramond Marrow ? ? ?Encounter Date: 02/11/2022 ? ? PT End of Session - 02/11/22 1111   ? ? Visit Number 29   ? Number of Visits 37   ? Date for PT Re-Evaluation 03/04/22   ? Authorization Type [employer]- no auth for first 12 visits- completed for re-auth on 01/21/22   ? Authorization Time Period 11/20/21-01/19/22 submitted 01/21/22; approved for 12 visits 01/22/22-03/08/22   ? Authorization - Visit Number 5   ? Authorization - Number of Visits 12   ? PT Start Time 1100   ? PT Stop Time 1145   ? PT Time Calculation (min) 45 min   ? ?  ?  ? ?  ? ? ?Past Medical History:  ?Diagnosis Date  ? Allergy   ? Anxiety   ? Depression   ? ? ?Past Surgical History:  ?Procedure Laterality Date  ? CESAREAN SECTION    ? NASAL SEPTUM SURGERY    ? ORIF PATELLA Right 09/19/2021  ? Procedure: OPEN REDUCTION INTERNAL (ORIF) FIXATION PATELLA;  Surgeon: Bjorn Pippin, MD;  Location: [facility];  Service: Orthopedics;  Laterality: Right;  ? ? ?There were no vitals filed for this visit. ? ? Subjective Assessment - 02/11/22 1110   ? ? Subjective No pain. Feeling good.   ? Currently in Pain? No/denies   ? ?  ?  ? ?  ? ? ?[facility] Adult PT Treatment:                                                DATE: 01/31/22 ?Therapeutic Exercise: ?Stair Stepper L3 x 5 minutes ?Able to negotiate 6 inch stairs without UE ?4 inch heel taps - continued difficulty - poor control  ?Front squat 25# 15 x 2  ?Dead lift 25# 15 x 2  ?Front static lunge with 1 UE support 10 x 2  ?Cone tap from R SLS 10 x 2  ?Leg press 80# 2 x 10 , right LE x 20# x 15 x 2 ?Step up (8 inch plus) with opp knee drive without UE 10 x 2 ?Dead bug leg ext 3 rounds of 10 each side  ?Prone plank on elbows 20  sec x3 ? ?S/L blue clam x 30 right , hip abduction with blue band at knees x 20 right  ? ? ? ? ? ? ? ? ? ? PT Short Term Goals - 11/27/21 1025   ? ?  ? PT SHORT TERM GOAL #1  ? Title Pt will be I with HEP for Rt LE ROM and strength and allowed by protocol   ? Baseline 11/07/21 reports compliance, reviewed gait pattern as HEP   ? Period Weeks   ? Status Achieved   ? Target Date 10/22/21   ?  ? PT SHORT TERM GOAL #2  ? Title Pt will be able to flex Rt knee to > 50 deg for progression of protocol and transfers   ? Baseline 11/07/21 86d flexion following AAROM heele slides   ? Period Weeks   ? Status Achieved   ? Target Date 10/22/21   ?  ?  PT SHORT TERM GOAL #3  ? Title Pt will be able to stand for ADLs with brace locked for no increased pain   ? Time 4   ? Period Weeks   ? Status Achieved   ?  ? PT SHORT TERM GOAL #4  ? Title Pt will be able to report use of ice for swelling, pain up to 3-5 times per day   ? Baseline ices as needed   ? Time 4   ? Period Weeks   ? Status Achieved   ? Target Date 10/22/21   ? ?  ?  ? ?  ? ? ? ? PT Long Term Goals - 01/31/22 1321   ? ?  ? PT LONG TERM GOAL #1  ? Title Pt will be I with final HEP upon discharge for long term strength. 11/16/21: progression in HEP are added as indicated   ? Baseline in progress   ? Time 6   ? Target Date 03/04/22   ?  ? PT LONG TERM GOAL #2  ? Title Pt will be able to show Rt knee extension to at least 4/5 for maximal gait stability and transfers. 11/16/21: R knee and hip strength = 3+/5, pt able to complete SLR s ext lag. 12/10/21: 4/5   ? Baseline Rt knee ext 4/5   ? Time 6   ? Period Weeks   ? Status Achieved   ? Target Date 03/04/22   ?  ? PT LONG TERM GOAL #3  ? Title Pt will demo Rt knee AROM 0- 120 deg for full ability to transfer and show optimal functional mobility. 11/14/21: Knee flexion =87d c hard endfeel   ? Baseline 0-130   ? Time 6   ? Period Weeks   ? Status Achieved   ?  ? PT LONG TERM GOAL #4  ? Title Pt will be able to walk in the  community with LRAD and min limp, pain < 3/10 for short errands, distances.: Pt is current walking c bledsoe brace opened to 90d. pt continues in Bledsoe brace per protocol   ? Baseline cont with intermittent limping end of the day or after therapy   ? Time 6   ? Period Weeks   ? Status On-going   ? Target Date 03/04/22   ?  ? PT LONG TERM GOAL #5  ? Title Pt will be able to negotiate 6 or more stairs with 1 rail or AD reciprocally with good technique and knee control.   ? Baseline can do this with a rail but poor control without rail   ? Time 6   ? Period Weeks   ? Status On-going   ? Target Date 03/04/22   ?  ? PT LONG TERM GOAL #6  ? Title Pt will be able to stand on one leg for hip and knee exercises with level pelvis, abduction 4+/5 or better   ? Time 6   ? Period Weeks   ? Status On-going   ? Target Date 03/04/22   ? ?  ?  ? ?  ? ? ? ? ? ? ? ? Plan - 02/11/22 1144   ? ? Clinical Impression Statement Nichole [place]'s confidence on stairs is improving without handrails. She still demonstrates decreased lateral hip stability with step downs. She is tolerating increased core challenges and single leg challenges.   ? PT Treatment/Interventions ADLs/Self Care Home Management;Stair training;Therapeutic exercise;Passive range of motion;Manual techniques;Neuromuscular re-education;Therapeutic activities;Electrical Stimulation;Functional mobility training;Balance training;Patient/family  education;Scar mobilization;Gait training;DME Instruction;Cryotherapy   ? PT Next Visit Plan cont hip, core and Rt quad in standing as tolerated. Attendion to level pelvis thorughout   ? PT Home Exercise Plan Access Code: [email or web address] added band to bridges and clam (has not been doing this)   ? Consulted and Agree with Plan of Care Patient   ? ?  ?  ? ?  ? ? ?Patient will benefit from skilled therapeutic intervention in order to improve the following deficits and impairments:  Abnormal gait, Decreased activity tolerance, Decreased endurance,  Decreased knowledge of use of DME, Decreased range of motion, Decreased skin integrity, Decreased strength, Increased fascial restricitons, Pain, Postural dysfunction, Decreased mobility, Difficulty walking, Increased edema ? ?Visit Diagnosis: ?S/P ORIF (open reduction internal fixation) fracture ? ?Difficulty in walking, not elsewhere classified ? ?Stiffness of right knee, not elsewhere classified ? ? ? ? ?Problem List ?There are no problems to display for this patient. ? ? ?Sherrie Mustache, PTA ?02/11/2022, 1:08 PM ? ?[place] ?[facility] ?[address] ?[place], [place], [address] ?Phone: [phone]   Fax:  [phone] ? ?Name: LENNYX VERDELL ?MRN: [id number] ?Date of Birth: 12-21-1975 ? ? ? ?

## 2022-02-14 ENCOUNTER — Ambulatory Visit: Payer: Medicaid Other | Admitting: Physical Therapy

## 2022-02-14 ENCOUNTER — Encounter: Payer: Self-pay | Admitting: Physical Therapy

## 2022-02-14 DIAGNOSIS — Z9889 Other specified postprocedural states: Secondary | ICD-10-CM | POA: Diagnosis not present

## 2022-02-14 DIAGNOSIS — M25661 Stiffness of right knee, not elsewhere classified: Secondary | ICD-10-CM

## 2022-02-14 DIAGNOSIS — R262 Difficulty in walking, not elsewhere classified: Secondary | ICD-10-CM

## 2022-02-14 NOTE — Therapy (Signed)
[place] ?[facility] ?[address] ?[place], [place], [address] ?Phone: [phone]   Fax:  [phone] ? ?Physical Therapy Treatment ? ?Patient Details  ?Name: Nichole [place] ?MRN: [id number] ?Date of Birth: 07-07-1976 ?Referring Provider (PT): Dr. Ramond Marrow ? ? ?Encounter Date: 02/14/2022 ? ? PT End of Session - 02/14/22 1208   ? ? Visit Number 30   ? Number of Visits 37   ? Date for PT Re-Evaluation 03/04/22   ? Authorization Type [employer]- no auth for first 12 visits- completed for re-auth on 01/21/22   ? Authorization Time Period 11/20/21-01/19/22 submitted 01/21/22; approved for 12 visits 01/22/22-03/08/22   ? Authorization - Visit Number 6   ? Authorization - Number of Visits 12   ? PT Start Time 1205   20 min late  ? PT Stop Time 1229   ? PT Time Calculation (min) 24 min   ? ?  ?  ? ?  ? ? ?Past Medical History:  ?Diagnosis Date  ? Allergy   ? Anxiety   ? Depression   ? ? ?Past Surgical History:  ?Procedure Laterality Date  ? CESAREAN SECTION    ? NASAL SEPTUM SURGERY    ? ORIF PATELLA Right 09/19/2021  ? Procedure: OPEN REDUCTION INTERNAL (ORIF) FIXATION PATELLA;  Surgeon: Bjorn Pippin, MD;  Location: [facility];  Service: Orthopedics;  Laterality: Right;  ? ? ?There were no vitals filed for this visit. ? ? Subjective Assessment - 02/14/22 1207   ? ? Subjective I can feel my knee, but no pain.   ? Currently in Pain? No/denies   ? ?  ?  ? ?  ? ? ?[facility] Adult PT Treatment:                                                DATE: 02/14/22 ?Therapeutic Exercise: ?Stair Stepper L3 x 5 minutes ?Sit-stand RLE back 10# x 20  ?Cone tap from R SLS 10 x 2  ?Leg press 80# 2 x 10 , right LE x 20# x 15 x 1 ?Dead lift 25# 15 x 2  ? ? ? ? ? PT Short Term Goals - 11/27/21 1025   ? ?  ? PT SHORT TERM GOAL #1  ? Title Pt will be I with HEP for Rt LE ROM and strength and allowed by protocol   ? Baseline 11/07/21 reports compliance, reviewed gait pattern as HEP   ? Period Weeks   ?  Status Achieved   ? Target Date 10/22/21   ?  ? PT SHORT TERM GOAL #2  ? Title Pt will be able to flex Rt knee to > 50 deg for progression of protocol and transfers   ? Baseline 11/07/21 86d flexion following AAROM heele slides   ? Period Weeks   ? Status Achieved   ? Target Date 10/22/21   ?  ? PT SHORT TERM GOAL #3  ? Title Pt will be able to stand for ADLs with brace locked for no increased pain   ? Time 4   ? Period Weeks   ? Status Achieved   ?  ? PT SHORT TERM GOAL #4  ? Title Pt will be able to report use of ice for swelling, pain up to 3-5 times per day   ? Baseline ices as  needed   ? Time 4   ? Period Weeks   ? Status Achieved   ? Target Date 10/22/21   ? ?  ?  ? ?  ? ? ? ? PT Long Term Goals - 01/31/22 1321   ? ?  ? PT LONG TERM GOAL #1  ? Title Pt will be I with final HEP upon discharge for long term strength. 11/16/21: progression in HEP are added as indicated   ? Baseline in progress   ? Time 6   ? Target Date 03/04/22   ?  ? PT LONG TERM GOAL #2  ? Title Pt will be able to show Rt knee extension to at least 4/5 for maximal gait stability and transfers. 11/16/21: R knee and hip strength = 3+/5, pt able to complete SLR s ext lag. 12/10/21: 4/5   ? Baseline Rt knee ext 4/5   ? Time 6   ? Period Weeks   ? Status Achieved   ? Target Date 03/04/22   ?  ? PT LONG TERM GOAL #3  ? Title Pt will demo Rt knee AROM 0- 120 deg for full ability to transfer and show optimal functional mobility. 11/14/21: Knee flexion =87d c hard endfeel   ? Baseline 0-130   ? Time 6   ? Period Weeks   ? Status Achieved   ?  ? PT LONG TERM GOAL #4  ? Title Pt will be able to walk in the community with LRAD and min limp, pain < 3/10 for short errands, distances.: Pt is current walking c bledsoe brace opened to 90d. pt continues in Bledsoe brace per protocol   ? Baseline cont with intermittent limping end of the day or after therapy   ? Time 6   ? Period Weeks   ? Status On-going   ? Target Date 03/04/22   ?  ? PT LONG TERM GOAL #5  ?  Title Pt will be able to negotiate 6 or more stairs with 1 rail or AD reciprocally with good technique and knee control.   ? Baseline can do this with a rail but poor control without rail   ? Time 6   ? Period Weeks   ? Status On-going   ? Target Date 03/04/22   ?  ? PT LONG TERM GOAL #6  ? Title Pt will be able to stand on one leg for hip and knee exercises with level pelvis, abduction 4+/5 or better   ? Time 6   ? Period Weeks   ? Status On-going   ? Target Date 03/04/22   ? ?  ?  ? ?  ? ? ? ? ? ? ? ? Plan - 02/14/22 1208   ? ? Clinical Impression Statement Pt arrived late today. Focsued Single leg knee/hip strength and stability with time available.   ? PT Treatment/Interventions ADLs/Self Care Home Management;Stair training;Therapeutic exercise;Passive range of motion;Manual techniques;Neuromuscular re-education;Therapeutic activities;Electrical Stimulation;Functional mobility training;Balance training;Patient/family education;Scar mobilization;Gait training;DME Instruction;Cryotherapy   ? PT Next Visit Plan cont hip, core and Rt quad in standing as tolerated. Attendion to level pelvis thorughout   ? PT Home Exercise Plan Access Code: [email or web address] added band to bridges and clam (has not been doing this)   ? Consulted and Agree with Plan of Care Patient   ? ?  ?  ? ?  ? ? ?Patient will benefit from skilled therapeutic intervention in order to improve the following deficits and impairments:  Abnormal gait,  Decreased activity tolerance, Decreased endurance, Decreased knowledge of use of DME, Decreased range of motion, Decreased skin integrity, Decreased strength, Increased fascial restricitons, Pain, Postural dysfunction, Decreased mobility, Difficulty walking, Increased edema ? ?Visit Diagnosis: ?S/P ORIF (open reduction internal fixation) fracture ? ?Difficulty in walking, not elsewhere classified ? ?Stiffness of right knee, not elsewhere classified ? ? ? ? ?Problem List ?There are no problems to display for this  patient. ? ? ?Sherrie Mustache, PTA ?02/14/2022, 12:27 PM ? ?[place] ?[facility] ?[address] ?[place], [place], [address] ?Phone: [phone]   Fax:  [phone] ? ?Name: Nich[place] ?MRN: [id number] ?Date of Birth: 12-31-1975 ? ? ? ?

## 2022-02-18 ENCOUNTER — Ambulatory Visit: Payer: Medicaid Other | Attending: Orthopaedic Surgery | Admitting: Physical Therapy

## 2022-02-18 ENCOUNTER — Encounter: Payer: Self-pay | Admitting: Physical Therapy

## 2022-02-18 DIAGNOSIS — R262 Difficulty in walking, not elsewhere classified: Secondary | ICD-10-CM | POA: Insufficient documentation

## 2022-02-18 DIAGNOSIS — M25661 Stiffness of right knee, not elsewhere classified: Secondary | ICD-10-CM | POA: Diagnosis present

## 2022-02-18 DIAGNOSIS — Z8781 Personal history of (healed) traumatic fracture: Secondary | ICD-10-CM | POA: Insufficient documentation

## 2022-02-18 DIAGNOSIS — Z9889 Other specified postprocedural states: Secondary | ICD-10-CM | POA: Diagnosis present

## 2022-02-18 NOTE — Therapy (Signed)
[place] ?[facility] ?[address] ?[place], [place], [address] ?Phone: [phone]   Fax:  [phone] ? ?Physical Therapy Treatment ? ?Patient Details  ?Name: [place] ?MRN: [id number] ?Date of Birth: [birth year] ?Referring Provider (PT): Dr. Ophelia Charter ? ? ?Encounter Date: 02/18/2022 ? ? PT End of Session - 02/18/22 1108   ? ? Visit Number 31   ? Number of Visits 37   ? Date for PT Re-Evaluation 03/04/22   ? Authorization Type [employer]- no auth for first 12 visits- completed for re-auth on 01/21/22   ? Authorization Time Period 11/20/21-01/19/22 submitted 01/21/22; approved for 12 visits 01/22/22-03/08/22   ? Authorization - Visit Number 7   ? Authorization - Number of Visits 12   ? PT Start Time 1103   ? PT Stop Time 1200   ? PT Time Calculation (min) 57 min   ? ?  ?  ? ?  ? ? ?Past Medical History:  ?Diagnosis Date  ? Allergy   ? Anxiety   ? Depression   ? ? ?Past Surgical History:  ?Procedure Laterality Date  ? CESAREAN SECTION    ? NASAL SEPTUM SURGERY    ? ORIF PATELLA Right 09/19/2021  ? Procedure: OPEN REDUCTION INTERNAL (ORIF) FIXATION PATELLA;  Surgeon: Hiram Gash, MD;  Location: [facility];  Service: Orthopedics;  Laterality: Right;  ? ? ?There were no vitals filed for this visit. ? ? Subjective Assessment - 02/18/22 1106   ? ? Subjective I was walking up steps alot yesterday and my knee is sore today. Not really in pain.   ? Currently in Pain? No/denies   ? ?  ?  ? ?  ? ?[facility] Adult PT Treatment:                                                DATE: 02/18/22 ?Therapeutic Exercise: ?Stair Stepper L3 x 5 minutes ?Able to negotiate 6 inch stairs without UE- improved control  ?Single leg STS 10# x 20  ?Front squat 25# 15 x 2  ?Dead lift 25# 15 x 2  ? ?Cone tap from R SLS 10 x 2 - used chair to work on technique- max cues ? ? ?Dead bug leg ext 3 rounds of 10 each side  ?Prone plank on elbows 30 sec x3 ? ?S/L blue clam x 30 right , hip abduction with  blue band at knees x 20 right  ? ? ? ? PT Short Term Goals - 11/27/21 1025   ? ?  ? PT SHORT TERM GOAL #1  ? Title Pt will be I with HEP for Rt LE ROM and strength and allowed by protocol   ? Baseline 11/07/21 reports compliance, reviewed gait pattern as HEP   ? Period Weeks   ? Status Achieved   ? Target Date 10/22/21   ?  ? PT SHORT TERM GOAL #2  ? Title Pt will be able to flex Rt knee to > 50 deg for progression of protocol and transfers   ? Baseline 11/07/21 86d flexion following AAROM heele slides   ? Period Weeks   ? Status Achieved   ? Target Date 10/22/21   ?  ? PT SHORT TERM GOAL #3  ? Title Pt will be able to stand for ADLs with brace locked for no  increased pain   ? Time 4   ? Period Weeks   ? Status Achieved   ?  ? PT SHORT TERM GOAL #4  ? Title Pt will be able to report use of ice for swelling, pain up to 3-5 times per day   ? Baseline ices as needed   ? Time 4   ? Period Weeks   ? Status Achieved   ? Target Date 10/22/21   ? ?  ?  ? ?  ? ? ? ? PT Long Term Goals - 02/18/22 1113   ? ?  ? PT LONG TERM GOAL #1  ? Title Pt will be I with final HEP upon discharge for long term strength. 11/16/21: progression in HEP are added as indicated   ? Baseline in progress   ? Time 6   ? Period Weeks   ? Status On-going   ? Target Date 03/04/22   ?  ? PT LONG TERM GOAL #2  ? Title Pt will be able to show Rt knee extension to at least 4/5 for maximal gait stability and transfers. 11/16/21: R knee and hip strength = 3+/5, pt able to complete SLR s ext lag. 12/10/21: 4/5   ? Time 6   ? Period Weeks   ? Status Achieved   ? Target Date 03/04/22   ?  ? PT LONG TERM GOAL #3  ? Title Pt will demo Rt knee AROM 0- 120 deg for full ability to transfer and show optimal functional mobility. 11/14/21: Knee flexion =87d c hard endfeel   ? Baseline 0-130   ? Time 6   ? Period Weeks   ? Status Achieved   ? Target Date 01/11/22   ?  ? PT LONG TERM GOAL #4  ? Title Pt will be able to walk in the community with LRAD and min limp, pain <  3/10 for short errands, distances.: Pt is current walking c bledsoe brace opened to 90d. pt continues in Bledsoe brace per protocol   ? Baseline cont with intermittent limping end of the day or after therapy   ? Period Weeks   ? Status On-going   ? Target Date 03/04/22   ?  ? PT LONG TERM GOAL #5  ? Title Pt will be able to negotiate 6 or more stairs with 1 rail or AD reciprocally with good technique and knee control.   ? Baseline improved control without HR on ascend and descend.   ? Time 6   ? Period Weeks   ? Status Achieved   ? Target Date 03/04/22   ?  ? PT LONG TERM GOAL #6  ? Title Pt will be able to stand on one leg for hip and knee exercises with level pelvis, abduction 4+/5 or better   ? Baseline unable to do this , hip drops due to weakness , hip abd 4/5   ? Time 6   ? Period Weeks   ? Status On-going   ? Target Date 03/04/22   ? ?  ?  ? ?  ? ? ? ? ? ? ? ? Plan - 02/18/22 1408   ? ? Clinical Impression Statement Pt demonstrates improved descent on stairs without use of UE and with good control. Continued with knee strengthening and hip stabilization. She has met LTG# 5.   ? PT Treatment/Interventions ADLs/Self Care Home Management;Stair training;Therapeutic exercise;Passive range of motion;Manual techniques;Neuromuscular re-education;Therapeutic activities;Electrical Stimulation;Functional mobility training;Balance training;Patient/family education;Scar mobilization;Gait training;DME Instruction;Cryotherapy   ?  PT Next Visit Plan cont hip, core and Rt quad in standing as tolerated. Attendion to level pelvis thorughout   ? PT Home Exercise Plan Access Code: [email or web address] added band to bridges and clam (has not been doing this)   ? ?  ?  ? ?  ? ? ?Patient will benefit from skilled therapeutic intervention in order to improve the following deficits and impairments:  Abnormal gait, Decreased activity tolerance, Decreased endurance, Decreased knowledge of use of DME, Decreased range of motion, Decreased skin  integrity, Decreased strength, Increased fascial restricitons, Pain, Postural dysfunction, Decreased mobility, Difficulty walking, Increased edema ? ?Visit Diagnosis: ?S/P ORIF (open reduction internal fixation) fracture ? ?Difficulty in walking, not elsewhere classified ? ?Stiffness of right knee, not elsewhere classified ? ? ? ? ?Problem List ?There are no problems to display for this patient. ? ? ?Dorene Ar, PTA ?02/18/2022, 2:09 PM ? ?[place] ?[facility] ?[address] ?[place], [place], [address] ?Phone: [phone]   Fax:  [phone] ? ?Name: Nichole [place] ?MRN: [id number] ?Date of Birth: 03-Mar-1976 ? ? ? ?

## 2022-02-21 ENCOUNTER — Ambulatory Visit: Payer: Medicaid Other | Admitting: Physical Therapy

## 2022-02-21 ENCOUNTER — Encounter: Payer: Self-pay | Admitting: Physical Therapy

## 2022-02-21 DIAGNOSIS — Z8781 Personal history of (healed) traumatic fracture: Secondary | ICD-10-CM

## 2022-02-21 DIAGNOSIS — R262 Difficulty in walking, not elsewhere classified: Secondary | ICD-10-CM

## 2022-02-21 DIAGNOSIS — M25661 Stiffness of right knee, not elsewhere classified: Secondary | ICD-10-CM

## 2022-02-21 DIAGNOSIS — Z9889 Other specified postprocedural states: Secondary | ICD-10-CM | POA: Diagnosis not present

## 2022-02-21 NOTE — Therapy (Signed)
[place] ?[facility] ?[address] ?[place], [place], [address] ?Phone: [phone]   Fax:  [phone] ? ?Physical Therapy Treatment ? ?Patient Details  ?Name: Nichole Allen ?MRN: [id number] ?Date of Birth: 1976/10/24 ?Referring Provider (PT): Dr. Ophelia Charter ? ? ?Encounter Date: 02/21/2022 ? ? PT End of Session - 02/21/22 1118   ? ? Visit Number 32   ? Number of Visits 37   ? Date for PT Re-Evaluation 03/04/22   ? Authorization Type [employer]- no auth for first 12 visits- completed for re-auth on 01/21/22   ? Authorization Time Period 11/20/21-01/19/22 submitted 01/21/22; approved for 12 visits 01/22/22-03/08/22   ? Authorization - Visit Number 8   ? Authorization - Number of Visits 12   ? PT Start Time 1112   ? PT Stop Time 1150   ? PT Time Calculation (min) 38 min   ? ?  ?  ? ?  ? ? ?Past Medical History:  ?Diagnosis Date  ? Allergy   ? Anxiety   ? Depression   ? ? ?Past Surgical History:  ?Procedure Laterality Date  ? CESAREAN SECTION    ? NASAL SEPTUM SURGERY    ? ORIF PATELLA Right 09/19/2021  ? Procedure: OPEN REDUCTION INTERNAL (ORIF) FIXATION PATELLA;  Surgeon: Hiram Gash, MD;  Location: [facility];  Service: Orthopedics;  Laterality: Right;  ? ? ?There were no vitals filed for this visit. ? ? ?[facility] Adult PT Treatment:                                                DATE: 02/21/22 ?Therapeutic Exercise: ?Stair Stepper L3 x 5 minutes ? ?Single leg STS 15# x 20  ?Front squat 25# 15 x 2  ?Dead lift 25# 15 x 2  ?100# leg press  ?20# single leg press  ?Cone tap from R SLS 10 x 2 - used parallel bar ? ? ?Dead bug leg ext 3 rounds of 10 each side  ? ?S/L blue clam x 30 right , hip abduction with blue band at knees x 20 right  ? ? ? ? ? ? PT Short Term Goals - 11/27/21 1025   ? ?  ? PT SHORT TERM GOAL #1  ? Title Pt will be I with HEP for Rt LE ROM and strength and allowed by protocol   ? Baseline 11/07/21 reports compliance, reviewed gait pattern as HEP   ? Period  Weeks   ? Status Achieved   ? Target Date 10/22/21   ?  ? PT SHORT TERM GOAL #2  ? Title Pt will be able to flex Rt knee to > 50 deg for progression of protocol and transfers   ? Baseline 11/07/21 86d flexion following AAROM heele slides   ? Period Weeks   ? Status Achieved   ? Target Date 10/22/21   ?  ? PT SHORT TERM GOAL #3  ? Title Pt will be able to stand for ADLs with brace locked for no increased pain   ? Time 4   ? Period Weeks   ? Status Achieved   ?  ? PT SHORT TERM GOAL #4  ? Title Pt will be able to report use of ice for swelling, pain up to 3-5 times per day   ? Baseline ices as needed   ?  Time 4   ? Period Weeks   ? Status Achieved   ? Target Date 10/22/21   ? ?  ?  ? ?  ? ? ? ? PT Long Term Goals - 02/18/22 1113   ? ?  ? PT LONG TERM GOAL #1  ? Title Pt will be I with final HEP upon discharge for long term strength. 11/16/21: progression in HEP are added as indicated   ? Baseline in progress   ? Time 6   ? Period Weeks   ? Status On-going   ? Target Date 03/04/22   ?  ? PT LONG TERM GOAL #2  ? Title Pt will be able to show Rt knee extension to at least 4/5 for maximal gait stability and transfers. 11/16/21: R knee and hip strength = 3+/5, pt able to complete SLR s ext lag. 12/10/21: 4/5   ? Time 6   ? Period Weeks   ? Status Achieved   ? Target Date 03/04/22   ?  ? PT LONG TERM GOAL #3  ? Title Pt will demo Rt knee AROM 0- 120 deg for full ability to transfer and show optimal functional mobility. 11/14/21: Knee flexion =87d c hard endfeel   ? Baseline 0-130   ? Time 6   ? Period Weeks   ? Status Achieved   ? Target Date 01/11/22   ?  ? PT LONG TERM GOAL #4  ? Title Pt will be able to walk in the community with LRAD and min limp, pain < 3/10 for short errands, distances.: Pt is current walking c bledsoe brace opened to 90d. pt continues in Bledsoe brace per protocol   ? Baseline cont with intermittent limping end of the day or after therapy   ? Period Weeks   ? Status On-going   ? Target Date 03/04/22    ?  ? PT LONG TERM GOAL #5  ? Title Pt will be able to negotiate 6 or more stairs with 1 rail or AD reciprocally with good technique and knee control.   ? Baseline improved control without HR on ascend and descend.   ? Time 6   ? Period Weeks   ? Status Achieved   ? Target Date 03/04/22   ?  ? PT LONG TERM GOAL #6  ? Title Pt will be able to stand on one leg for hip and knee exercises with level pelvis, abduction 4+/5 or better   ? Baseline unable to do this , hip drops due to weakness , hip abd 4/5   ? Time 6   ? Period Weeks   ? Status On-going   ? Target Date 03/04/22   ? ?  ?  ? ?  ? ? ? ? ? ? ? ? Plan - 02/21/22 1143   ? ? Clinical Impression Statement improving staility with single leg challenges. She is less compliant with HEP.   ? PT Treatment/Interventions ADLs/Self Care Home Management;Stair training;Therapeutic exercise;Passive range of motion;Manual techniques;Neuromuscular re-education;Therapeutic activities;Electrical Stimulation;Functional mobility training;Balance training;Patient/family education;Scar mobilization;Gait training;DME Instruction;Cryotherapy   ? PT Next Visit Plan cont hip, core and Rt quad in standing as tolerated. Attendion to level pelvis thorughout   ? ?  ?  ? ?  ? ? ?Patient will benefit from skilled therapeutic intervention in order to improve the following deficits and impairments:  Abnormal gait, Decreased activity tolerance, Decreased endurance, Decreased knowledge of use of DME, Decreased range of motion, Decreased skin integrity, Decreased strength,  Increased fascial restricitons, Pain, Postural dysfunction, Decreased mobility, Difficulty walking, Increased edema ? ?Visit Diagnosis: ?S/P ORIF (open reduction internal fixation) fracture ? ?Difficulty in walking, not elsewhere classified ? ?Stiffness of right knee, not elsewhere classified ? ? ? ? ?Problem List ?There are no problems to display for this patient. ? ? ?Dorene Ar, PTA ?02/21/2022, 11:46 AM ? ?[facility] ?[address] ?[place], [place], [address] ?Phone: [phone]   Fax:  [phone] ? ?Name: NUZHAT DAGLE ?MRN: [id number] ?Date of Birth: 11-18-1976 ? ? ? ?

## 2022-02-27 ENCOUNTER — Ambulatory Visit: Payer: Medicaid Other | Admitting: Physical Therapy

## 2022-02-28 ENCOUNTER — Encounter: Payer: Self-pay | Admitting: Physical Therapy

## 2022-03-05 ENCOUNTER — Ambulatory Visit: Payer: Medicaid Other | Admitting: Physical Therapy

## 2022-03-05 ENCOUNTER — Encounter: Payer: Self-pay | Admitting: Physical Therapy

## 2022-03-05 DIAGNOSIS — Z8781 Personal history of (healed) traumatic fracture: Secondary | ICD-10-CM

## 2022-03-05 DIAGNOSIS — M25661 Stiffness of right knee, not elsewhere classified: Secondary | ICD-10-CM

## 2022-03-05 DIAGNOSIS — R262 Difficulty in walking, not elsewhere classified: Secondary | ICD-10-CM

## 2022-03-05 DIAGNOSIS — Z9889 Other specified postprocedural states: Secondary | ICD-10-CM | POA: Diagnosis not present

## 2022-03-05 NOTE — Therapy (Signed)
?[facility] ?[address] ?[place], [place], [address] ?Phone: [phone]   Fax:  [phone] ? ?Physical Therapy Treatment ? ?Patient Details  ?Name: Nichole Allen ?MRN: [id number] ?Date of Birth: 1976-04-25 ?Referring Provider (PT): Dr. Ramond Marrow ? ? ?Encounter Date: 03/05/2022 ? ? PT End of Session - 03/05/22 1239   ? ? Visit Number 33   ? Number of Visits 37   ? Date for PT Re-Evaluation 03/04/22   ? Authorization Type [employer]- no auth for first 12 visits- completed for re-auth on 01/21/22   ? Authorization Time Period 11/20/21-01/19/22 submitted 01/21/22; approved for 12 visits 01/22/22-03/08/22   ? Authorization - Visit Number 9   ? Authorization - Number of Visits 12   ? PT Start Time 1231   ? PT Stop Time 1313   ? PT Time Calculation (min) 42 min   ? ?  ?  ? ?  ? ? ?Past Medical History:  ?Diagnosis Date  ? Allergy   ? Anxiety   ? Depression   ? ? ?Past Surgical History:  ?Procedure Laterality Date  ? CESAREAN SECTION    ? NASAL SEPTUM SURGERY    ? ORIF PATELLA Right 09/19/2021  ? Procedure: OPEN REDUCTION INTERNAL (ORIF) FIXATION PATELLA;  Surgeon: Bjorn Pippin, MD;  Location: [facility];  Service: Orthopedics;  Laterality: Right;  ? ? ?There were no vitals filed for this visit. ? ? ?[facility] Adult PT Treatment:                                                DATE: 02/21/22 ?Therapeutic Exercise: ?Stair Stepper L3 x 5 minutes ?Cone tap from R SLS 10 x 2 - used parallel bar 1 set ?Bilat hopping in place ?Up/down stairs -no UE ?4 inch retro step up  ?4 inch step down heel strike - needs 2 UE to avoid hip drop ?Dead lift 25# 15 x 2  ?Front squat 25# 15 x 2  ?Single leg STS 15# x 20  ?Dead bug leg ext 3 rounds of 10 each side  ? ?S/L blue clam x 30 right , hip abduction with blue band at knees x 20 right  ? ? ? ? ? ? PT Short Term Goals - 11/27/21 1025   ? ?  ? PT SHORT TERM GOAL #1  ? Title Pt will be I with HEP for Rt LE ROM and strength and allowed by protocol    ? Baseline 11/07/21 reports compliance, reviewed gait pattern as HEP   ? Period Weeks   ? Status Achieved   ? Target Date 10/22/21   ?  ? PT SHORT TERM GOAL #2  ? Title Pt will be able to flex Rt knee to > 50 deg for progression of protocol and transfers   ? Baseline 11/07/21 86d flexion following AAROM heele slides   ? Period Weeks   ? Status Achieved   ? Target Date 10/22/21   ?  ? PT SHORT TERM GOAL #3  ? Title Pt will be able to stand for ADLs with brace locked for no increased pain   ? Time 4   ? Period Weeks   ? Status Achieved   ?  ? PT SHORT TERM GOAL #4  ? Title Pt will be able to report use of ice for swelling,  pain up to 3-5 times per day   ? Baseline ices as needed   ? Time 4   ? Period Weeks   ? Status Achieved   ? Target Date 10/22/21   ? ?  ?  ? ?  ? ? ? ? PT Long Term Goals - 02/18/22 1113   ? ?  ? PT LONG TERM GOAL #1  ? Title Pt will be I with final HEP upon discharge for long term strength. 11/16/21: progression in HEP are added as indicated   ? Baseline in progress   ? Time 6   ? Period Weeks   ? Status On-going   ? Target Date 03/04/22   ?  ? PT LONG TERM GOAL #2  ? Title Pt will be able to show Rt knee extension to at least 4/5 for maximal gait stability and transfers. 11/16/21: R knee and hip strength = 3+/5, pt able to complete SLR s ext lag. 12/10/21: 4/5   ? Time 6   ? Period Weeks   ? Status Achieved   ? Target Date 03/04/22   ?  ? PT LONG TERM GOAL #3  ? Title Pt will demo Rt knee AROM 0- 120 deg for full ability to transfer and show optimal functional mobility. 11/14/21: Knee flexion =87d c hard endfeel   ? Baseline 0-130   ? Time 6   ? Period Weeks   ? Status Achieved   ? Target Date 01/11/22   ?  ? PT LONG TERM GOAL #4  ? Title Pt will be able to walk in the community with LRAD and min limp, pain < 3/10 for short errands, distances.: Pt is current walking c bledsoe brace opened to 90d. pt continues in Bledsoe brace per protocol   ? Baseline cont with intermittent limping end of the day  or after therapy   ? Period Weeks   ? Status On-going   ? Target Date 03/04/22   ?  ? PT LONG TERM GOAL #5  ? Title Pt will be able to negotiate 6 or more stairs with 1 rail or AD reciprocally with good technique and knee control.   ? Baseline improved control without HR on ascend and descend.   ? Time 6   ? Period Weeks   ? Status Achieved   ? Target Date 03/04/22   ?  ? PT LONG TERM GOAL #6  ? Title Pt will be able to stand on one leg for hip and knee exercises with level pelvis, abduction 4+/5 or better   ? Baseline unable to do this , hip drops due to weakness , hip abd 4/5   ? Time 6   ? Period Weeks   ? Status On-going   ? Target Date 03/04/22   ? ?  ?  ? ?  ? ? ? ? ? ? ? ? Plan - 03/05/22 1303   ? ? Clinical Impression Statement Pt reports a small "tumble" since she was here last and felt something crunch in knee with some resultant increased pain that is improved today. She was able to complete prescribed closed chain exercises without increased pain. She had mild increased pain with introduction to hopping . Needs UE to perform step downs without compensation in pelvis. POC ends this week.   ? PT Treatment/Interventions ADLs/Self Care Home Management;Stair training;Therapeutic exercise;Passive range of motion;Manual techniques;Neuromuscular re-education;Therapeutic activities;Electrical Stimulation;Functional mobility training;Balance training;Patient/family education;Scar mobilization;Gait training;DME Instruction;Cryotherapy   ? PT Next Visit Plan  cont hip, core and Rt quad in standing as tolerated. Attention to level pelvis thorughout   ? PT Home Exercise Plan Access Code: [email or web address] added band to bridges and clam (has not been doing this)   ? Consulted and Agree with Plan of Care Patient   ? ?  ?  ? ?  ? ? ?Patient will benefit from skilled therapeutic intervention in order to improve the following deficits and impairments:  Abnormal gait, Decreased activity tolerance, Decreased endurance, Decreased  knowledge of use of DME, Decreased range of motion, Decreased skin integrity, Decreased strength, Increased fascial restricitons, Pain, Postural dysfunction, Decreased mobility, Difficulty walking, Increased edema ? ?Visit Diagnosis: ?S/P ORIF (open reduction internal fixation) fracture ? ?Difficulty in walking, not elsewhere classified ? ?Stiffness of right knee, not elsewhere classified ? ? ? ? ?Problem List ?There are no problems to display for this patient. ? ? ?Sherrie Mustache, PTA ?03/05/2022, 1:13 PM ? ?[facility] ?[facility] ?[address] ?[place], [place], [address] ?Phone: [phone]   Fax:  [phone] ? ?Name: Nichole Allen ?MRN: [id number] ?Date of Birth: [birth year] ? ? ? ?

## 2022-03-06 ENCOUNTER — Ambulatory Visit: Payer: Medicaid Other | Admitting: Physical Therapy

## 2022-03-07 ENCOUNTER — Ambulatory Visit: Payer: Medicaid Other | Admitting: Physical Therapy

## 2022-03-07 ENCOUNTER — Encounter: Payer: Self-pay | Admitting: Physical Therapy

## 2022-03-07 DIAGNOSIS — M25661 Stiffness of right knee, not elsewhere classified: Secondary | ICD-10-CM

## 2022-03-07 DIAGNOSIS — Z9889 Other specified postprocedural states: Secondary | ICD-10-CM | POA: Diagnosis not present

## 2022-03-07 DIAGNOSIS — Z8781 Personal history of (healed) traumatic fracture: Secondary | ICD-10-CM

## 2022-03-07 DIAGNOSIS — R262 Difficulty in walking, not elsewhere classified: Secondary | ICD-10-CM

## 2022-03-07 NOTE — Therapy (Addendum)
[place] ?[facility] ?[address] ?[place], [place], [address] ?Phone: [phone]   Fax:  [phone] ? ?Physical Therapy Treatment/Discharge ? ?Patient Details  ?Name: Nichole Allen ?MRN: [id number] ?Date of Birth: 1976/02/10 ?Referring Provider (PT): Dr. Ophelia Charter ? ? ?Encounter Date: 03/07/2022 ? ? PT End of Session - 03/07/22 1201   ? ? Visit Number 34   ? Number of Visits 37   ? Date for PT Re-Evaluation 03/04/22   ? Authorization Type [employer]- no auth for first 12 visits- completed for re-auth on 01/21/22   ? Authorization Time Period 11/20/21-01/19/22 submitted 01/21/22; approved for 12 visits 01/22/22-03/08/22   ? Authorization - Visit Number 10   ? Authorization - Number of Visits 12   ? PT Start Time 1152   ? PT Stop Time 1230   ? PT Time Calculation (min) 38 min   ? ?  ?  ? ?  ? ? ?Past Medical History:  ?Diagnosis Date  ? Allergy   ? Anxiety   ? Depression   ? ? ?Past Surgical History:  ?Procedure Laterality Date  ? CESAREAN SECTION    ? NASAL SEPTUM SURGERY    ? ORIF PATELLA Right 09/19/2021  ? Procedure: OPEN REDUCTION INTERNAL (ORIF) FIXATION PATELLA;  Surgeon: Hiram Gash, MD;  Location: [facility];  Service: Orthopedics;  Laterality: Right;  ? ? ?There were no vitals filed for this visit. ? ? Subjective Assessment - 03/07/22 1203   ? ? Subjective The knee is still "crackeling" since I fell. But it is better.   ? Currently in Pain? No/denies   ? ?  ?  ? ?  ? ? ? ? [facility] PT Assessment - 03/07/22 0001   ? ?  ? PROM  ? Overall PROM Comments 145   ?  ? Strength  ? Right Hip ABduction 4+/5   ? ?  ?  ? ?  ? ? ?[facility] PT Treatment:                                                DATE: 02/21/22 ?Therapeutic Exercise: ?Stair Stepper L3 x 5 minutes ?Cone tap from R SLS 10 x 2 - intermittent UE touch ?Standing single leg squat ? ? ?4 inch step down heel strike - needs 2 UE to avoid hip drop ?Dead lift 25# 15 x 2  ?Front squat 25# 15 x 2  ?Single leg STS  15# x 20  ?Dead bug leg ext 3 rounds of 10 each side  ? ?S/L blue clam x 30 right , hip abduction with blue band at knees x 20 right  ? ? ? ? ? ? ? ? ? ? ? PT Short Term Goals - 11/27/21 1025   ? ?  ? PT SHORT TERM GOAL #1  ? Title Pt will be I with HEP for Rt LE ROM and strength and allowed by protocol   ? Baseline 11/07/21 reports compliance, reviewed gait pattern as HEP   ? Period Weeks   ? Status Achieved   ? Target Date 10/22/21   ?  ? PT SHORT TERM GOAL #2  ? Title Pt will be able to flex Rt knee to > 50 deg for progression of protocol and transfers   ? Baseline 11/07/21 86d flexion following AAROM heele slides   ?  Period Weeks   ? Status Achieved   ? Target Date 10/22/21   ?  ? PT SHORT TERM GOAL #3  ? Title Pt will be able to stand for ADLs with brace locked for no increased pain   ? Time 4   ? Period Weeks   ? Status Achieved   ?  ? PT SHORT TERM GOAL #4  ? Title Pt will be able to report use of ice for swelling, pain up to 3-5 times per day   ? Baseline ices as needed   ? Time 4   ? Period Weeks   ? Status Achieved   ? Target Date 10/22/21   ? ?  ?  ? ?  ? ? ? ? PT Long Term Goals - 03/07/22 1212   ? ?  ? PT LONG TERM GOAL #1  ? Title Pt will be I with final HEP upon discharge for long term strength. 11/16/21: progression in HEP are added as indicated   ? Time 6   ? Period Weeks   ? Status Achieved   ?  ? PT LONG TERM GOAL #2  ? Title Pt will be able to show Rt knee extension to at least 4/5 for maximal gait stability and transfers. 11/16/21: R knee and hip strength = 3+/5, pt able to complete SLR s ext lag. 12/10/21: 4/5   ? Time 6   ? Period Weeks   ? Status Achieved   ?  ? PT LONG TERM GOAL #3  ? Title Pt will demo Rt knee AROM 0- 120 deg for full ability to transfer and show optimal functional mobility. 11/14/21: Knee flexion =87d c hard endfeel   ? Time 6   ? Period Weeks   ? Status Achieved   ?  ? PT LONG TERM GOAL #4  ? Title Pt will be able to walk in the community with LRAD and min limp, pain <  3/10 for short errands, distances.: Pt is current walking c bledsoe brace opened to 90d. pt continues in Bledsoe brace per protocol   ? Time 6   ? Period Weeks   ? Status Achieved   ?  ? PT LONG TERM GOAL #5  ? Title Pt will be able to negotiate 6 or more stairs with 1 rail or AD reciprocally with good technique and knee control.   ? Time 6   ? Period Weeks   ? Status Achieved   ?  ? PT LONG TERM GOAL #6  ? Title Pt will be able to stand on one leg for hip and knee exercises with level pelvis, abduction 4+/5 or better   ? Baseline needs UE assist, improving. hi abduction 4+/5   ? Time 6   ? Period Weeks   ? Status Partially Met   ? Target Date 03/04/22   ? ?  ?  ? ?  ? ? ? ? ? ? ? ? Plan - 03/07/22 1220   ? ? Clinical Impression Statement Pt reports the knee still cracks and pops with active knee flexion /extension in open chain. This is happening since she fell several days ago. Otherwise she has not pain and is improved in her single leg stability. She has Met most long term goals. She is independent with advanced strengthening HEP and plans to go to the gym for continued improvement. Her lateral hip strength has improved. She is agreeable to discharge to HEP. She was encouraged to seek a primary  care physician for any future health concerns.   ? PT Treatment/Interventions ADLs/Self Care Home Management;Stair training;Therapeutic exercise;Passive range of motion;Manual techniques;Neuromuscular re-education;Therapeutic activities;Electrical Stimulation;Functional mobility training;Balance training;Patient/family education;Scar mobilization;Gait training;DME Instruction;Cryotherapy   ? PT Next Visit Plan discharge to HEP today   ? PT Home Exercise Plan Access Code: [facility] )-   ? Consulted and Agree with Plan of Care Patient   ? ?  ?  ? ?  ? ? ?Patient will benefit from skilled therapeutic intervention in order to improve the following deficits and impairments:  Abnormal gait, Decreased activity tolerance,  Decreased endurance, Decreased knowledge of use of DME, Decreased range of motion, Decreased skin integrity, Decreased strength, Increased fascial restricitons, Pain, Postural dysfunction, Decreased mobility, Difficulty walking, Increased edema ? ?Visit Diagnosis: ?S/P ORIF (open reduction internal fixation) fracture ? ?Difficulty in walking, not elsewhere classified ? ?Stiffness of right knee, not elsewhere classified ? ? ? ? ?Problem List ?There are no problems to display for this patient. ? ? ?Dorene Ar, PTA ?03/07/2022, 1:48 PM ? ?[facility] [facility] ?[facility] ?[address] ?[place], [place], [address] ?Phone: [phone]   Fax:  [phone] ? ?Name: Nichole Allen ?MRN: [id number] ?Date of Birth: 1976/08/08 ? ?PHYSICAL THERAPY DISCHARGE SUMMARY ? ?Visits from Start of Care: 34 ? ?Current functional level related to goals / functional outcomes: ?See above  ?  ?Remaining deficits: ?Hip and core weakness ?  ?Education / Equipment: ?Extensive on HEP, RICE, activity modifications, general exercise   ? ?Patient agrees to discharge. Patient goals were met. Patient is being discharged due to meeting the stated rehab goals.  ? ?Raeford Razor, PT ?03/08/22 9:52 AM ?Phone: [phone] ?Fax: [phone]  ? ?

## 2022-09-11 ENCOUNTER — Other Ambulatory Visit: Payer: Self-pay | Admitting: Obstetrics and Gynecology

## 2022-09-11 DIAGNOSIS — Z1231 Encounter for screening mammogram for malignant neoplasm of breast: Secondary | ICD-10-CM

## 2022-09-20 ENCOUNTER — Encounter: Payer: Self-pay | Admitting: Obstetrics and Gynecology

## 2022-09-26 ENCOUNTER — Other Ambulatory Visit: Payer: Self-pay | Admitting: Certified Nurse Midwife

## 2022-09-26 DIAGNOSIS — R2231 Localized swelling, mass and lump, right upper limb: Secondary | ICD-10-CM

## 2022-12-02 ENCOUNTER — Ambulatory Visit: Payer: Medicaid Other | Admitting: Nurse Practitioner

## 2022-12-02 ENCOUNTER — Encounter: Payer: Self-pay | Admitting: Nurse Practitioner

## 2022-12-02 VITALS — BP 118/78 | HR 72 | Ht 64.0 in | Wt 169.0 lb

## 2022-12-02 DIAGNOSIS — Z720 Tobacco use: Secondary | ICD-10-CM | POA: Insufficient documentation

## 2022-12-02 DIAGNOSIS — Z1211 Encounter for screening for malignant neoplasm of colon: Secondary | ICD-10-CM

## 2022-12-02 DIAGNOSIS — F419 Anxiety disorder, unspecified: Secondary | ICD-10-CM

## 2022-12-02 DIAGNOSIS — Z0000 Encounter for general adult medical examination without abnormal findings: Secondary | ICD-10-CM

## 2022-12-02 DIAGNOSIS — Z23 Encounter for immunization: Secondary | ICD-10-CM

## 2022-12-02 DIAGNOSIS — E663 Overweight: Secondary | ICD-10-CM | POA: Diagnosis not present

## 2022-12-02 LAB — CBC
HCT: 41.6 % (ref 36.0–46.0)
Hemoglobin: 13.9 g/dL (ref 12.0–15.0)
MCHC: 33.5 g/dL (ref 30.0–36.0)
MCV: 96.3 fl (ref 78.0–100.0)
Platelets: 288 10*3/uL (ref 150.0–400.0)
RBC: 4.32 Mil/uL (ref 3.87–5.11)
RDW: 13.7 % (ref 11.5–15.5)
WBC: 8.7 10*3/uL (ref 4.0–10.5)

## 2022-12-02 LAB — LIPID PANEL
Cholesterol: 230 mg/dL — ABNORMAL HIGH (ref 0–200)
HDL: 58.4 mg/dL (ref >39.00)
LDL Cholesterol: 147 mg/dL — ABNORMAL HIGH (ref 0–99)
NonHDL: 171.45
Total CHOL/HDL Ratio: 4
Triglycerides: 123 mg/dL (ref 0.0–149.0)
VLDL: 24.6 mg/dL (ref 0.0–40.0)

## 2022-12-02 LAB — HEMOGLOBIN A1C: Hgb A1c MFr Bld: 5.3 % (ref 4.6–6.5)

## 2022-12-02 LAB — TSH: TSH: 2.14 u[IU]/mL (ref 0.35–5.50)

## 2022-12-02 LAB — COMPREHENSIVE METABOLIC PANEL
ALT: 23 U/L (ref 0–35)
AST: 21 U/L (ref 0–37)
Albumin: 3.7 g/dL (ref 3.5–5.2)
Alkaline Phosphatase: 51 U/L (ref 39–117)
BUN: 10 mg/dL (ref 6–23)
CO2: 26 mEq/L (ref 19–32)
Calcium: 8.7 mg/dL (ref 8.4–10.5)
Chloride: 105 mEq/L (ref 96–112)
Creatinine, Ser: 0.6 mg/dL (ref 0.40–1.20)
GFR: 107.73 mL/min (ref >60.00)
Glucose, Bld: 93 mg/dL (ref 70–99)
Potassium: 4.6 mEq/L (ref 3.5–5.1)
Sodium: 136 mEq/L (ref 135–145)
Total Bilirubin: 0.4 mg/dL (ref 0.2–1.2)
Total Protein: 5.8 g/dL — ABNORMAL LOW (ref 6.0–8.3)

## 2022-12-02 LAB — POCT URINALYSIS DIPSTICK
Bilirubin, UA: NEGATIVE
Glucose, UA: NEGATIVE
Ketones, UA: NEGATIVE
Leukocytes, UA: NEGATIVE
Nitrite, UA: NEGATIVE
Protein, UA: NEGATIVE
Spec Grav, UA: 1.01 (ref 1.010–1.025)
Urobilinogen, UA: 0.2 E.U./dL
pH, UA: 5.5 (ref 5.0–8.0)

## 2022-12-02 NOTE — Assessment & Plan Note (Signed)
Discussed age-appropriate musicians and screening exams.  Pap smear, mammogram are up-to-date.  Update tetanus shot today.  Patient refuses flu vaccine and COVID vaccines.  Ambulatory referral to GI for CRC screening.  Patient was given information at discharge in regards to preventative healthcare maintenance with anticipatory guidance for age range

## 2022-12-02 NOTE — Assessment & Plan Note (Signed)
Patient sometimes smoker.  Will check [facility] today for blood

## 2022-12-02 NOTE — Assessment & Plan Note (Signed)
Patient is followed by psychiatry currently on Klonopin 1 mg up to 4 times a day.  Continue taking medication as prescribed follow psychiatrist recommended

## 2022-12-02 NOTE — Progress Notes (Signed)
New Patient Office Visit  Subjective    Patient ID: Nichole Allen, female    DOB: 10-04-1976  Age: 47 y.o. MRN: [id number]  CC:  Chief Complaint  Patient presents with   Establish Care    HPI [name] presents to establish care   ADD: adderall and is followed by Dr. Toy Care, every 3-6 months on office visits  Anxiety : States that she is on Klonopin.  States she is working with her psychiatrist to wean down on the medication as she does not think she needs it several times a day.  for complete physical and follow up of chronic conditions.  Immunizations: -Tetanus: Completed in 12/02/2022 -Influenza: refused -Shingles: too young -Pneumonia: too young   Covid: refused -HPV: aged out   Diet: Nichole Allen. States that she is eating breakfast. States that she will have either lunch and dinner. Water all day. Some coffee Exercise: No regular exercise. States that she use to walk but since the knee not so much. States that she is working two jobs. 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  Mammogram: Completed in 09/2022 [facility] [name]   Colonoscopy: Amb refer for  [facility] Lung Cancer Screening: does not qualify  Dexa: NA   Sleep: States that she goes to bed around 1030-1130. Feels rested sometimes. Does snore    Outpatient Encounter Medications as of 12/02/2022  Medication Sig   amphetamine-dextroamphetamine (ADDERALL) 30 MG tablet Take 1 tablet by mouth 2 (two) times daily.   clonazePAM (KLONOPIN) 1 MG tablet Take 1-2 mg by mouth 2 (two) times daily as needed for anxiety.   [DISCONTINUED] clonazePAM (KLONOPIN) 0.5 MG tablet Take 0.5-2.5 mg by mouth daily.   [DISCONTINUED] meloxicam (MOBIC) 15 MG tablet Take 1 tablet (15 mg total) by mouth daily.   [DISCONTINUED] sertraline (ZOLOFT) 50 MG tablet Take 50 mg by mouth daily. (Patient not taking: Reported on  09/24/2021)   No facility-administered encounter medications on file as of 12/02/2022.    Past Medical History:  Diagnosis Date   Allergy    Anxiety    Depression     Past Surgical History:  Procedure Laterality Date   CESAREAN SECTION     NASAL SEPTUM SURGERY     ORIF PATELLA Right 09/19/2021   Procedure: OPEN REDUCTION INTERNAL (ORIF) FIXATION PATELLA;  Surgeon: Hiram Gash, MD;  Location: [facility];  Service: Orthopedics;  Laterality: Right;    Family History  Problem Relation Age of Onset   Cancer Mother    Breast cancer Mother    Hypertension Father    Hyperlipidemia Father    Crohn's disease Father    Cancer Paternal Grandfather     Social History   Socioeconomic History   Marital status: Single    Spouse name: Not on file   Number of children: 3   Years of education: Not on file   Highest education level: Not on file  Occupational History   Not on file  Tobacco Use   Smoking status: Some Days    Packs/day: 0.25    Years: 26.00    Total pack years: 6.50    Types: Cigarettes   Smokeless tobacco: Never  Vaping Use   Vaping Use: Never used  Substance and Sexual Activity   Alcohol use: Yes    Alcohol/week: 2.0 standard drinks of alcohol  Types: 2 Standard drinks or equivalent per week    Comment: socaiily   Drug use: No   Sexual activity: Not on file  Other Topics Concern   Not on file  Social History Narrative   Hair dressing full time      Nichole Allen (15) (f)   Nichole Allen (15) (f)   Nichole Allen (18) (M)      Hobbies: likes to go to the pool    Social Determinants of Corporate investment banker Strain: Not on file  Food Insecurity: Not on file  Transportation Needs: Not on file  Physical Activity: Not on file  Stress: Not on file  Social Connections: Not on file  Intimate Partner Violence: Not on file    Review of Systems  Constitutional:  Negative for chills and fever.  Respiratory:  Negative for shortness of breath.    Cardiovascular:  Negative for chest pain and leg swelling.  Gastrointestinal:  Negative for abdominal pain, blood in stool, constipation, diarrhea, nausea and vomiting.       BM daily   Genitourinary:  Negative for dysuria and hematuria.  Neurological:  Negative for tingling and headaches.  Psychiatric/Behavioral:  Negative for hallucinations and suicidal ideas.         Objective    BP 118/78   Pulse 72   Ht 5\' 4"  (1.626 m)   Wt 169 lb (76.7 kg)   LMP 12/01/2022   SpO2 97%   BMI 29.01 kg/m   Physical Exam Vitals and nursing note reviewed.  Constitutional:      Appearance: Normal appearance.  HENT:     Right Ear: Tympanic membrane, ear canal and external ear normal.     Left Ear: Tympanic membrane, ear canal and external ear normal.     Mouth/Throat:     Mouth: Mucous membranes are moist.     Pharynx: Oropharynx is clear.  Eyes:     Extraocular Movements: Extraocular movements intact.     Pupils: Pupils are equal, round, and reactive to light.  Cardiovascular:     Rate and Rhythm: Normal rate and regular rhythm.     Pulses: Normal pulses.     Heart sounds: Normal heart sounds.  Pulmonary:     Effort: Pulmonary effort is normal.     Breath sounds: Normal breath sounds.  Abdominal:     General: Bowel sounds are normal. There is no distension.     Palpations: There is no mass.     Tenderness: There is no abdominal tenderness.     Hernia: No hernia is present.  Musculoskeletal:     Right lower leg: No edema.     Left lower leg: No edema.  Lymphadenopathy:     Cervical: No cervical adenopathy.  Skin:    General: Skin is warm.  Neurological:     General: No focal deficit present.     Mental Status: She is alert.     Deep Tendon Reflexes:     Reflex Scores:      Bicep reflexes are 2+ on the right side and 2+ on the left side.      Patellar reflexes are 2+ on the right side and 2+ on the left side.    Comments: Bilateral upper and lower extremity strength 5/5   Psychiatric:        Mood and Affect: Mood normal.        Behavior: Behavior normal.        Thought Content: Thought content normal.  Judgment: Judgment normal.         Assessment & Plan:   Problem List Items Addressed This Visit       Other   Preventative health care - Primary    Discussed age-appropriate musicians and screening exams.  Pap smear, mammogram are up-to-date.  Update tetanus shot today.  Patient refuses flu vaccine and COVID vaccines.  Ambulatory referral to GI for CRC screening.  Patient was given information at discharge in regards to preventative healthcare maintenance with anticipatory guidance for age range      Relevant Orders   CBC   Lipid panel   Comprehensive metabolic panel   Hemoglobin A1c   TSH   Anxiety    Patient is followed by psychiatry currently on Klonopin 1 mg up to 4 times a day.  Continue taking medication as prescribed follow psychiatrist recommended      Overweight   Relevant Orders   Lipid panel   Hemoglobin A1c   TSH   Tobacco use    Patient sometimes smoker.  Will check UA today for blood      Relevant Orders   POCT urinalysis dipstick (Completed)   Other Visit Diagnoses     Screening for colon cancer       Relevant Orders   Ambulatory referral to Gastroenterology   Need for tetanus, diphtheria, and acellular pertussis (Tdap) vaccine       Relevant Orders   Tdap vaccine greater than or equal to 7yo IM (Completed)       Return in about 1 year (around 12/03/2023) for CPE and Labs.   Romilda Garret, NP

## 2022-12-02 NOTE — Patient Instructions (Signed)
Nice to see you today I will be in touch with the labs once I have them We did update your tetanus vaccine today Follow up with me in 1 year for your next physical, sooner if you need me

## 2023-04-11 ENCOUNTER — Encounter: Payer: Self-pay | Admitting: Nurse Practitioner

## 2023-04-11 ENCOUNTER — Ambulatory Visit: Payer: Medicaid Other | Admitting: Nurse Practitioner

## 2023-04-11 VITALS — BP 118/78 | HR 75 | Temp 97.6°F | Resp 16 | Ht 64.0 in | Wt 156.5 lb

## 2023-04-11 DIAGNOSIS — R309 Painful micturition, unspecified: Secondary | ICD-10-CM | POA: Diagnosis not present

## 2023-04-11 DIAGNOSIS — N3091 Cystitis, unspecified with hematuria: Secondary | ICD-10-CM | POA: Diagnosis not present

## 2023-04-11 DIAGNOSIS — R8290 Unspecified abnormal findings in urine: Secondary | ICD-10-CM | POA: Insufficient documentation

## 2023-04-11 LAB — POC URINALSYSI DIPSTICK (AUTOMATED)
Bilirubin, UA: NEGATIVE
Glucose, UA: NEGATIVE
Ketones, UA: NEGATIVE
Nitrite, UA: NEGATIVE
Protein, UA: NEGATIVE
Spec Grav, UA: 1.015 (ref 1.010–1.025)
Urobilinogen, UA: 0.2 E.U./dL
pH, UA: 6 (ref 5.0–8.0)

## 2023-04-11 MED ORDER — NITROFURANTOIN MONOHYD MACRO 100 MG PO CAPS: 100.0000 mg | ORAL_CAPSULE | Freq: Two times a day (BID) | ORAL | 0 refills | Status: AC

## 2023-04-11 NOTE — Assessment & Plan Note (Signed)
UA in office 

## 2023-04-11 NOTE — Assessment & Plan Note (Signed)
UA indicative of urinary tract infection.  Will treat patient with Macrobid 100 mg twice daily for 5 days.  Urine culture.  Patient can use AZO over-the-counter for 2 days and then stop.  Did inform patient of urine color change if using AZO.

## 2023-04-11 NOTE — Progress Notes (Signed)
Acute Office Visit  Subjective:     Patient ID: Nichole Allen, female    DOB: 06/07/1976, 47 y.o.   MRN: [id number]  Chief Complaint  Patient presents with   Urinary Tract Infection    X 1 week     Patient is in today for urinary symptoms with a history that is non contributaory   States that it has been going on for a week. States that she does drink plenty of fluid and started azo capusles that might have helped.  States some pressure and burning.  Review of Systems  Constitutional:  Negative for chills and fever.  Gastrointestinal:  Negative for abdominal pain, constipation, diarrhea, nausea and vomiting.  Genitourinary:  Positive for dysuria and frequency. Negative for hematuria and urgency.  Musculoskeletal:  Negative for back pain.        Objective:    BP 118/78   Pulse 75   Temp 97.6 F (36.4 C)   Resp 16   Ht 5\' 4"  (1.626 m)   Wt 156 lb 8 oz (71 kg)   LMP 03/28/2023   SpO2 99%   BMI 26.86 kg/m    Physical Exam Vitals and nursing note reviewed.  Constitutional:      Appearance: Normal appearance.  Cardiovascular:     Rate and Rhythm: Normal rate and regular rhythm.     Heart sounds: Normal heart sounds.  Pulmonary:     Effort: Pulmonary effort is normal.     Breath sounds: Normal breath sounds.  Abdominal:     General: Bowel sounds are normal. There is no distension.     Palpations: There is no mass.     Tenderness: There is no abdominal tenderness. There is no right CVA tenderness or left CVA tenderness.     Hernia: No hernia is present.  Neurological:     Mental Status: She is alert.     Results for orders placed or performed in visit on 04/11/23  POCT Urinalysis Dipstick (Automated)  Result Value Ref Range   Color, UA yellow    Clarity, UA cloudy    Glucose, UA Negative Negative   Bilirubin, UA Negative    Ketones, UA Negative    Spec Grav, UA 1.015 1.010 - 1.025   Blood, UA 2+    pH, UA 6.0 5.0 - 8.0   Protein, UA Negative  Negative   Urobilinogen, UA 0.2 0.2 or 1.0 E.U./dL   Nitrite, UA Negative    Leukocytes, UA Large (3+) (A) Negative        Assessment & Plan:   Problem List Items Addressed This Visit       Genitourinary   Cystitis with hematuria    UA indicative of urinary tract infection.  Will treat patient with Macrobid 100 mg twice daily for 5 days.  Urine culture.  Patient can use AZO over-the-counter for 2 days and then stop.  Did inform patient of urine color change if using AZO.      Relevant Medications   nitrofurantoin, macrocrystal-monohydrate, (MACROBID) 100 MG capsule   Other Relevant Orders   Urine Culture     Other   Painful urination - Primary    UA in office.      Relevant Orders   POCT Urinalysis Dipstick (Automated) (Completed)    Meds ordered this encounter  Medications   nitrofurantoin, macrocrystal-monohydrate, (MACROBID) 100 MG capsule    Sig: Take 1 capsule (100 mg total) by mouth 2 (two) times daily for 5  days.    Dispense:  10 capsule    Refill:  0    Order Specific Question:   Supervising Provider    Answer:   TOWER, MARNE A [[id number]    Return if symptoms worsen or fail to improve.  Audria Nine, NP

## 2023-04-11 NOTE — Patient Instructions (Signed)
Nice to see you today I will be in touch with the urine culture once I have it Drink plenty of water. You can take the AZO for 2 days, it will change the color of your urine and orange/red color Follow up if no improvement

## 2023-04-13 LAB — URINE CULTURE
MICRO NUMBER:: 15001429
SPECIMEN QUALITY:: ADEQUATE

## 2023-12-05 ENCOUNTER — Encounter: Payer: Self-pay | Admitting: Nurse Practitioner

## 2023-12-05 ENCOUNTER — Ambulatory Visit (INDEPENDENT_AMBULATORY_CARE_PROVIDER_SITE_OTHER): Payer: Medicaid Other | Admitting: Nurse Practitioner

## 2023-12-05 VITALS — BP 114/82 | HR 66 | Temp 98.0°F | Ht 64.0 in | Wt 162.4 lb

## 2023-12-05 DIAGNOSIS — Z13220 Encounter for screening for lipoid disorders: Secondary | ICD-10-CM

## 2023-12-05 DIAGNOSIS — Z1211 Encounter for screening for malignant neoplasm of colon: Secondary | ICD-10-CM

## 2023-12-05 DIAGNOSIS — Z720 Tobacco use: Secondary | ICD-10-CM | POA: Diagnosis not present

## 2023-12-05 DIAGNOSIS — Z131 Encounter for screening for diabetes mellitus: Secondary | ICD-10-CM

## 2023-12-05 DIAGNOSIS — Z114 Encounter for screening for human immunodeficiency virus [HIV]: Secondary | ICD-10-CM

## 2023-12-05 DIAGNOSIS — Z1159 Encounter for screening for other viral diseases: Secondary | ICD-10-CM

## 2023-12-05 DIAGNOSIS — Z1231 Encounter for screening mammogram for malignant neoplasm of breast: Secondary | ICD-10-CM

## 2023-12-05 DIAGNOSIS — Z0000 Encounter for general adult medical examination without abnormal findings: Secondary | ICD-10-CM | POA: Diagnosis not present

## 2023-12-05 DIAGNOSIS — E663 Overweight: Secondary | ICD-10-CM

## 2023-12-05 LAB — COMPREHENSIVE METABOLIC PANEL
ALT: 19 U/L (ref 0–35)
AST: 17 U/L (ref 0–37)
Albumin: 4 g/dL (ref 3.5–5.2)
Alkaline Phosphatase: 61 U/L (ref 39–117)
BUN: 14 mg/dL (ref 6–23)
CO2: 28 meq/L (ref 19–32)
Calcium: 9.2 mg/dL (ref 8.4–10.5)
Chloride: 104 meq/L (ref 96–112)
Creatinine, Ser: 0.57 mg/dL (ref 0.40–1.20)
GFR: 108.3 mL/min (ref >60.00)
Glucose, Bld: 78 mg/dL (ref 70–99)
Potassium: 4.6 meq/L (ref 3.5–5.1)
Sodium: 138 meq/L (ref 135–145)
Total Bilirubin: 0.7 mg/dL (ref 0.2–1.2)
Total Protein: 6.5 g/dL (ref 6.0–8.3)

## 2023-12-05 LAB — CBC
HCT: 43 % (ref 36.0–46.0)
Hemoglobin: 14.5 g/dL (ref 12.0–15.0)
MCHC: 33.7 g/dL (ref 30.0–36.0)
MCV: 95 fL (ref 78.0–100.0)
Platelets: 285 10*3/uL (ref 150.0–400.0)
RBC: 4.52 Mil/uL (ref 3.87–5.11)
RDW: 13.9 % (ref 11.5–15.5)
WBC: 7 10*3/uL (ref 4.0–10.5)

## 2023-12-05 LAB — URINALYSIS, MICROSCOPIC ONLY: RBC / HPF: NONE SEEN (ref 0–?)

## 2023-12-05 LAB — LIPID PANEL
Cholesterol: 318 mg/dL — ABNORMAL HIGH (ref 0–200)
HDL: 62.2 mg/dL (ref >39.00)
LDL Cholesterol: 216 mg/dL — ABNORMAL HIGH (ref 0–99)
NonHDL: 255.44
Total CHOL/HDL Ratio: 5
Triglycerides: 199 mg/dL — ABNORMAL HIGH (ref 0.0–149.0)
VLDL: 39.8 mg/dL (ref 0.0–40.0)

## 2023-12-05 LAB — HEMOGLOBIN A1C: Hgb A1c MFr Bld: 5.5 % (ref 4.6–6.5)

## 2023-12-05 LAB — TSH: TSH: 2.51 u[IU]/mL (ref 0.35–5.50)

## 2023-12-05 NOTE — Assessment & Plan Note (Signed)
Discussed age-appropriate immunizations and screening exams.  Did review patient's personal, surgical, social, family histories.  Patient is up-to-date with all age-appropriate vaccinations he would like.  Patient refused flu vaccine today.  Ambulatory referral to GI for CRC screening.  Mammogram for breast cancer screening.  Patient is up-to-date on cervical cancer screening.  Patient information at discharge about preventative healthcare maintenance with anticipatory guidance

## 2023-12-05 NOTE — Patient Instructions (Signed)
Nice to see you today I will be in touch with the labs once I have reviewed them Call and schedule your mammogram at [facility].  Follow up with me in 1 year, sooner if you need me

## 2023-12-05 NOTE — Assessment & Plan Note (Signed)
Pending urine microscopy to rule out microscopic hematuria

## 2023-12-05 NOTE — Progress Notes (Signed)
Established Patient Office Visit  Subjective   Patient ID: Nichole Allen, female    DOB: [birth year]  Age: 48 y.o. MRN: [id number]  Chief Complaint  Patient presents with   Annual Exam    HPI   for complete physical and follow up of chronic conditions.   [facility]: Dr. Evelene Croon goes every 6 months.  States that she is weaning on the clonapem. She is prescribed Adderall also.   Immunizations: -Tetanus: Completed in 2024 -Influenza: refused  -Shingles: Too young -Pneumonia: Too young  Diet: Fair diet. She will eat 2-3 meals and snacks. States that hshe drinks water normally  [facility] 60 mins at at home. Weights and cardio   Eye exam: PRN  Dental exam: Completes semi-annually    Colonoscopy: Ambulatory referral to [facility] Lung Cancer Screening: N/A  Pap smear: states that she had one within a year and a half at [facility].  States no history of abnormals  Mammogram: Overdue.  [facility] [place]  Dexa: Too young  Sleep: goes to bed around 10 and will get up around 6-7. Feels rested. Does snore       Review of Systems  Constitutional:  Negative for chills and fever.  Respiratory:  Negative for shortness of breath.   Cardiovascular:  Negative for chest pain and leg swelling.  Gastrointestinal:  Negative for abdominal pain, blood in stool, constipation, diarrhea, nausea and vomiting.       BM daily   Genitourinary:  Negative for dysuria and hematuria.  Neurological:  Negative for tingling and headaches.  Psychiatric/Behavioral:  Negative for hallucinations and suicidal ideas.       Objective:     BP 114/82   Pulse 66   Temp 98 F (36.7 C) (Oral)   Ht 5\' 4"  (1.626 m)   Wt 162 lb 6.4 oz (73.7 kg)   SpO2 98%   BMI 27.88 kg/m  BP Readings from Last 3 Encounters:  12/05/23 114/82  04/11/23 118/78  12/02/22 118/78   Wt Readings from Last 3 Encounters:  12/05/23 162 lb 6.4 oz (73.7 kg)  04/11/23  156 lb 8 oz (71 kg)  12/02/22 169 lb (76.7 kg)   SpO2 Readings from Last 3 Encounters:  12/05/23 98%  04/11/23 99%  12/02/22 97%      Physical Exam Vitals and nursing note reviewed.  Constitutional:      Appearance: Normal appearance.  HENT:     Right Ear: Tympanic membrane, ear canal and external ear normal.     Left Ear: Tympanic membrane, ear canal and external ear normal.     Mouth/Throat:     Mouth: Mucous membranes are moist.     Pharynx: Oropharynx is clear.  Eyes:     Extraocular Movements: Extraocular movements intact.     Pupils: Pupils are equal, round, and reactive to light.  Cardiovascular:     Rate and Rhythm: Normal rate and regular rhythm.     Pulses: Normal pulses.     Heart sounds: Normal heart sounds.  Pulmonary:     Effort: Pulmonary effort is normal.     Breath sounds: Normal breath sounds.  Abdominal:     General: Bowel sounds are normal. There is no distension.     Palpations: There is no mass.     Tenderness: There is no abdominal tenderness.     Hernia: No hernia is present.  Musculoskeletal:     Right lower  leg: No edema.     Left lower leg: No edema.  Lymphadenopathy:     Cervical: No cervical adenopathy.  Skin:    General: Skin is warm.  Neurological:     General: No focal deficit present.     Mental Status: She is alert.     Deep Tendon Reflexes:     Reflex Scores:      Bicep reflexes are 2+ on the right side and 2+ on the left side.      Patellar reflexes are 2+ on the right side and 2+ on the left side.    Comments: Bilateral upper and lower extremity strength 5/5  Psychiatric:        Mood and Affect: Mood normal.        Behavior: Behavior normal.        Thought Content: Thought content normal.        Judgment: Judgment normal.      No results found for any visits on 12/05/23.    The 10-year ASCVD risk score (Arnett DK, et al., 2019) is: 2.7%    Assessment & Plan:   Problem List Items Addressed This Visit        Other   Preventative health care - Primary   Discussed age-appropriate immunizations and screening exams.  Did review patient's personal, surgical, social, family histories.  Patient is up-to-date with all age-appropriate vaccinations he would like.  Patient refused flu vaccine today.  Ambulatory referral to GI for CRC screening.  Mammogram for breast cancer screening.  Patient is up-to-date on cervical cancer screening.  Patient information at discharge about preventative healthcare maintenance with anticipatory guidance      Relevant Orders   CBC   Comprehensive metabolic panel   TSH   Overweight   Pending TSH, A1c, lipid panel.  Continue working on lifestyle modifications      Tobacco abuse   Pending urine microscopy to rule out microscopic hematuria.      Relevant Orders   Urine Microscopic   Other Visit Diagnoses       Encounter for hepatitis C screening test for low risk patient       Relevant Orders   Hepatitis C Antibody     Encounter for screening for HIV       Relevant Orders   HIV antibody (with reflex)     Screening for lipid disorders       Relevant Orders   Lipid panel     Screening mammogram for breast cancer       Relevant Orders   MM 3D SCREENING MAMMOGRAM BILATERAL BREAST     Screening for colon cancer       Relevant Orders   Ambulatory referral to Gastroenterology     Screening for diabetes mellitus       Relevant Orders   Hemoglobin A1c       Return in about 1 year (around 12/04/2024) for CPE and Labs.    Audria Nine, NP

## 2023-12-05 NOTE — Assessment & Plan Note (Signed)
Pending TSH, A1c, lipid panel.  Continue working on lifestyle modifications.

## 2023-12-06 LAB — HIV ANTIBODY (ROUTINE TESTING W REFLEX): HIV 1&2 Ab, 4th Generation: NONREACTIVE

## 2023-12-06 LAB — HEPATITIS C ANTIBODY: Hepatitis C Ab: NONREACTIVE

## 2023-12-09 ENCOUNTER — Encounter: Payer: Self-pay | Admitting: Nurse Practitioner

## 2023-12-10 ENCOUNTER — Telehealth: Payer: Self-pay | Admitting: Nurse Practitioner

## 2023-12-10 DIAGNOSIS — E785 Hyperlipidemia, unspecified: Secondary | ICD-10-CM

## 2023-12-10 NOTE — Addendum Note (Signed)
Addended by: Eden Emms on: 12/10/2023 02:26 PM   Modules accepted: Orders

## 2023-12-10 NOTE — Telephone Encounter (Signed)
I am ok with her working on lifestyle changes. We can do a recheck in 3 months of the cholesterol fasting. Does she want to talk to a nutritionist?

## 2023-12-10 NOTE — Telephone Encounter (Signed)
-----   Message from [facility] T sent at 12/09/2023  2:17 PM EST ----- Last read by Vivia Birmingham at 1:29 PM on 12/09/2023.  Pt would like to try a lifestyle diet change before getting on any cholesterol medication. Pt wants to know if she tries the diet first, will she still need fasting labs in 3 months to recheck levels? Or do you require her to be on the medication?

## 2023-12-10 NOTE — Telephone Encounter (Addendum)
Scheduled pt for fasting labs. 03/10/24 @ 8:45AM.   Pt would like to speak with a nutritionist only if covered by insurance.

## 2023-12-12 ENCOUNTER — Telehealth: Payer: Self-pay | Admitting: Nurse Practitioner

## 2023-12-12 DIAGNOSIS — E785 Hyperlipidemia, unspecified: Secondary | ICD-10-CM

## 2023-12-12 MED ORDER — ROSUVASTATIN CALCIUM 10 MG PO TABS: 10.0000 mg | ORAL_TABLET | Freq: Every day | ORAL | 1 refills | Status: DC

## 2023-12-12 NOTE — Telephone Encounter (Signed)
Patient would like a call back from the nurse to discuss medication for cholesterol

## 2023-12-12 NOTE — Telephone Encounter (Signed)
Spoke with patient and she would like to go ahead with the cholesterol medication. She is still going to work on diet and lifestyle changes but to ease her mind she wants the med sent in also.

## 2023-12-12 NOTE — Addendum Note (Signed)
Addended by: Eden Emms on: 12/12/2023 04:46 PM   Modules accepted: Orders

## 2023-12-12 NOTE — Telephone Encounter (Signed)
He will still need the lab appointment in 3 months. Medication sent in

## 2023-12-15 NOTE — Telephone Encounter (Signed)
Can we call the patient and see what questions she has please

## 2023-12-16 NOTE — Telephone Encounter (Signed)
This message has already been addressed in a TE.

## 2023-12-16 NOTE — Telephone Encounter (Signed)
Appt made on 03/10/24 at 8:45 am.

## 2024-01-20 ENCOUNTER — Encounter: Payer: Self-pay | Admitting: Gastroenterology

## 2024-01-28 ENCOUNTER — Ambulatory Visit: Payer: Medicaid Other | Admitting: Dietician

## 2024-01-28 ENCOUNTER — Telehealth: Payer: Self-pay

## 2024-01-28 ENCOUNTER — Other Ambulatory Visit: Payer: Self-pay

## 2024-01-28 ENCOUNTER — Ambulatory Visit (AMBULATORY_SURGERY_CENTER)

## 2024-01-28 VITALS — Ht 64.0 in | Wt 160.0 lb

## 2024-01-28 DIAGNOSIS — Z1211 Encounter for screening for malignant neoplasm of colon: Secondary | ICD-10-CM

## 2024-01-28 MED ORDER — SUFLAVE 178.7 G PO SOLR: 1.0000 | ORAL | 0 refills | Status: DC

## 2024-01-28 NOTE — Progress Notes (Signed)
 Denies allergies to eggs or soy products. Denies complication of anesthesia or sedation. Denies use of weight loss medication. Denies use of O2.   Emmi instructions given for colonoscopy.

## 2024-01-28 NOTE — Telephone Encounter (Signed)
 Patient called for PV.

## 2024-02-11 ENCOUNTER — Ambulatory Visit: Admitting: Nurse Practitioner

## 2024-02-11 VITALS — BP 112/82 | HR 63 | Temp 99.0°F | Ht 64.0 in | Wt 163.8 lb

## 2024-02-11 DIAGNOSIS — Z01818 Encounter for other preprocedural examination: Secondary | ICD-10-CM | POA: Diagnosis not present

## 2024-02-11 LAB — CBC WITH DIFFERENTIAL/PLATELET
Basophils Absolute: 0.1 10*3/uL (ref 0.0–0.1)
Basophils Relative: 1.1 % (ref 0.0–3.0)
Eosinophils Absolute: 0.3 10*3/uL (ref 0.0–0.7)
Eosinophils Relative: 3.7 % (ref 0.0–5.0)
HCT: 40.9 % (ref 36.0–46.0)
Hemoglobin: 13.7 g/dL (ref 12.0–15.0)
Lymphocytes Relative: 35 % (ref 12.0–46.0)
Lymphs Abs: 2.9 10*3/uL (ref 0.7–4.0)
MCHC: 33.5 g/dL (ref 30.0–36.0)
MCV: 95.2 fl (ref 78.0–100.0)
Monocytes Absolute: 0.8 10*3/uL (ref 0.1–1.0)
Monocytes Relative: 9.4 % (ref 3.0–12.0)
Neutro Abs: 4.1 10*3/uL (ref 1.4–7.7)
Neutrophils Relative %: 50.8 % (ref 43.0–77.0)
Platelets: 324 10*3/uL (ref 150.0–400.0)
RBC: 4.3 Mil/uL (ref 3.87–5.11)
RDW: 14.4 % (ref 11.5–15.5)
WBC: 8.2 10*3/uL (ref 4.0–10.5)

## 2024-02-11 LAB — COMPREHENSIVE METABOLIC PANEL
ALT: 16 U/L (ref 0–35)
AST: 16 U/L (ref 0–37)
Albumin: 4 g/dL (ref 3.5–5.2)
Alkaline Phosphatase: 55 U/L (ref 39–117)
BUN: 16 mg/dL (ref 6–23)
CO2: 25 meq/L (ref 19–32)
Calcium: 8.9 mg/dL (ref 8.4–10.5)
Chloride: 105 meq/L (ref 96–112)
Creatinine, Ser: 0.52 mg/dL (ref 0.40–1.20)
GFR: 110.58 mL/min (ref >60.00)
Glucose, Bld: 87 mg/dL (ref 70–99)
Potassium: 4.3 meq/L (ref 3.5–5.1)
Sodium: 136 meq/L (ref 135–145)
Total Bilirubin: 0.3 mg/dL (ref 0.2–1.2)
Total Protein: 6.6 g/dL (ref 6.0–8.3)

## 2024-02-11 LAB — APTT: aPTT: 29 s (ref 25.4–36.8)

## 2024-02-11 LAB — PROTIME-INR
INR: 0.9 ratio (ref 0.8–1.0)
Prothrombin Time: 9.8 s (ref 9.6–13.1)

## 2024-02-11 NOTE — Patient Instructions (Signed)
 Nice to see you today I will be in touch with the labs once I have reviewed them  Follow up with me as scheduled for albs  Call and schedule the mammogram  [facility] [address] ( on hospital grounds) [place], [place]  [phone]

## 2024-02-11 NOTE — Assessment & Plan Note (Signed)
 Patient has planned cosmetic surgery including a breast lift and abdominoplasty.  Persist.  She has discontinued smoking.  Pending her labs and follow labs are acceptable with normal limits will clear patient medically from cosmetic procedure.  Patient was given information on her discharge paperwork to call and schedule mammogram which is a requirement of the Engineer, [employer].

## 2024-02-11 NOTE — Progress Notes (Signed)
 Acute Office Visit  Subjective:     Patient ID: Nichole Allen, female    DOB: 1976/03/19, 48 y.o.   MRN: [id number]  Chief Complaint  Patient presents with   surgical clearance    Pt complains of surgery for abdominal plasty and breast augmentation on 03/04/24.     HPI Patient is in today for surgical clearance patient is planning on getting breast augmentation and tummy tuck    States no trouble with anesthesia. Patient does have a history of surgery but not breast or abdominal surgeries sans a C-section No personal or family history of MH  States that she has stopped smoking for the past week and a half. States that she would smoke only 1 cigarette a night   Patient does have a history of elevated LDL of 216 but was place don crestor 10mg . She has been watching what she eats and has not been taking the medication. She does not have established CAD. She is not having any symptoms. No family history of CAD  Review of Systems  Constitutional:  Negative for chills and fever.  Respiratory:  Negative for shortness of breath.   Cardiovascular:  Negative for chest pain.  Neurological:  Negative for dizziness and headaches.        Objective:    BP 112/82   Pulse 63   Temp 99 F (37.2 C) (Oral)   Ht 5\' 4"  (1.626 m)   Wt 163 lb 12.8 oz (74.3 kg)   LMP 02/02/2024 (Approximate)   SpO2 99%   BMI 28.12 kg/m    Physical Exam Vitals and nursing note reviewed.  Constitutional:      Appearance: Normal appearance.  Cardiovascular:     Rate and Rhythm: Normal rate and regular rhythm.     Heart sounds: Normal heart sounds.  Pulmonary:     Effort: Pulmonary effort is normal.     Breath sounds: Normal breath sounds.  Neurological:     Mental Status: She is alert.     Results for orders placed or performed in visit on 02/11/24  CBC with Differential/Platelet  Result Value Ref Range   WBC 8.2 4.0 - 10.5 K/uL   RBC 4.30 3.87 - 5.11 Mil/uL   Hemoglobin 13.7 12.0 - 15.0 g/dL    HCT 46.9 62.9 - 52.8 %   MCV 95.2 78.0 - 100.0 fl   MCHC 33.5 30.0 - 36.0 g/dL   RDW 41.3 24.4 - 01.0 %   Platelets 324.0 150.0 - 400.0 K/uL   Neutrophils Relative % 50.8 43.0 - 77.0 %   Lymphocytes Relative 35.0 12.0 - 46.0 %   Monocytes Relative 9.4 3.0 - 12.0 %   Eosinophils Relative 3.7 0.0 - 5.0 %   Basophils Relative 1.1 0.0 - 3.0 %   Neutro Abs 4.1 1.4 - 7.7 K/uL   Lymphs Abs 2.9 0.7 - 4.0 K/uL   Monocytes Absolute 0.8 0.1 - 1.0 K/uL   Eosinophils Absolute 0.3 0.0 - 0.7 K/uL   Basophils Absolute 0.1 0.0 - 0.1 K/uL  Comprehensive metabolic panel  Result Value Ref Range   Sodium 136 135 - 145 mEq/L   Potassium 4.3 3.5 - 5.1 mEq/L   Chloride 105 96 - 112 mEq/L   CO2 25 19 - 32 mEq/L   Glucose, Bld 87 70 - 99 mg/dL   BUN 16 6 - 23 mg/dL   Creatinine, Ser 2.72 0.40 - 1.20 mg/dL   Total Bilirubin 0.3 0.2 - 1.2 mg/dL  Alkaline Phosphatase 55 39 - 117 U/L   AST 16 0 - 37 U/L   ALT 16 0 - 35 U/L   Total Protein 6.6 6.0 - 8.3 g/dL   Albumin 4.0 3.5 - 5.2 g/dL   GFR 161.09 >60.45 mL/min   Calcium 8.9 8.4 - 10.5 mg/dL  Protime-INR  Result Value Ref Range   INR 0.9 0.8 - 1.0 ratio   Prothrombin Time 9.8 9.6 - 13.1 sec  APTT  Result Value Ref Range   aPTT 29.0 25.4 - 36.8 SEC        Assessment & Plan:   Problem List Items Addressed This Visit       Other   Preoperative clearance - Primary   Patient has planned cosmetic surgery including a breast lift and abdominoplasty.  Persist.  She has discontinued smoking.  Pending her labs and follow labs are acceptable with normal limits will clear patient medically from cosmetic procedure.  Patient was given information on her discharge paperwork to call and schedule mammogram which is a requirement of the [employer], [employer].      Relevant Orders   CBC with Differential/Platelet (Completed)   Comprehensive metabolic panel (Completed)   Protime-INR (Completed)   APTT (Completed)   MM 3D SCREENING MAMMOGRAM BILATERAL BREAST     No orders of the defined types were placed in this encounter.   Return if symptoms worsen or fail to improve, for As scheduled.  Audria Nine, NP

## 2024-02-12 ENCOUNTER — Encounter

## 2024-02-12 ENCOUNTER — Encounter: Payer: Self-pay | Admitting: Nurse Practitioner

## 2024-02-12 ENCOUNTER — Other Ambulatory Visit: Payer: Self-pay | Admitting: *Deleted

## 2024-02-12 ENCOUNTER — Ambulatory Visit
Admission: RE | Admit: 2024-02-12 | Discharge: 2024-02-12 | Disposition: A | Discharge status: Home / Self Care | Source: Ambulatory Visit | Attending: Nurse Practitioner | Admitting: Nurse Practitioner

## 2024-02-12 ENCOUNTER — Inpatient Hospital Stay
Admission: RE | Admit: 2024-02-12 | Discharge: 2024-02-12 | Disposition: A | Discharge status: Home / Self Care | Payer: Self-pay | Source: Ambulatory Visit | Attending: Nurse Practitioner

## 2024-02-12 ENCOUNTER — Inpatient Hospital Stay
Admission: RE | Admit: 2024-02-12 | Discharge: 2024-02-12 | Disposition: A | Discharge status: Home / Self Care | Payer: Self-pay | Source: Ambulatory Visit | Attending: Nurse Practitioner | Admitting: Nurse Practitioner

## 2024-02-12 DIAGNOSIS — Z1231 Encounter for screening mammogram for malignant neoplasm of breast: Secondary | ICD-10-CM

## 2024-02-12 DIAGNOSIS — Z01818 Encounter for other preprocedural examination: Secondary | ICD-10-CM | POA: Diagnosis present

## 2024-02-12 NOTE — Telephone Encounter (Signed)
 Contacted pt to inform her of the attached OV notes and lab work she needs to pick up for her surgical clearance. Pt states that she lives up the road and will stop by to pick up forms.   Copy made for office to be scanned in chart.

## 2024-02-17 ENCOUNTER — Encounter: Payer: Self-pay | Admitting: Nurse Practitioner

## 2024-02-17 NOTE — Telephone Encounter (Signed)
 Pt has picked up mammogram results from the front office.

## 2024-02-17 NOTE — Telephone Encounter (Signed)
 Pt came by office to pick up mammogram results and was told by front office to retrieve the images from [facility] radiology where the initial mammogram was done.

## 2024-02-17 NOTE — Telephone Encounter (Signed)
 Contacted pt regarding [facility] fax number. Pt was able to retreive their email.

## 2024-02-19 ENCOUNTER — Ambulatory Visit: Admitting: Nurse Practitioner

## 2024-02-19 ENCOUNTER — Encounter: Admitting: Gastroenterology

## 2024-03-10 ENCOUNTER — Other Ambulatory Visit: Payer: Medicaid Other

## 2024-03-31 ENCOUNTER — Other Ambulatory Visit

## 2024-04-14 ENCOUNTER — Other Ambulatory Visit (INDEPENDENT_AMBULATORY_CARE_PROVIDER_SITE_OTHER)

## 2024-04-14 DIAGNOSIS — E785 Hyperlipidemia, unspecified: Secondary | ICD-10-CM

## 2024-04-14 LAB — LIPID PANEL
Cholesterol: 260 mg/dL — ABNORMAL HIGH (ref 0–200)
HDL: 66.8 mg/dL (ref >39.00)
LDL Cholesterol: 171 mg/dL — ABNORMAL HIGH (ref 0–99)
NonHDL: 192.89
Total CHOL/HDL Ratio: 4
Triglycerides: 111 mg/dL (ref 0.0–149.0)
VLDL: 22.2 mg/dL (ref 0.0–40.0)

## 2024-04-14 LAB — HEPATIC FUNCTION PANEL
ALT: 20 U/L (ref 0–35)
AST: 19 U/L (ref 0–37)
Albumin: 3.6 g/dL (ref 3.5–5.2)
Alkaline Phosphatase: 54 U/L (ref 39–117)
Bilirubin, Direct: 0.1 mg/dL (ref 0.0–0.3)
Total Bilirubin: 0.5 mg/dL (ref 0.2–1.2)
Total Protein: 5.8 g/dL — ABNORMAL LOW (ref 6.0–8.3)

## 2024-04-16 ENCOUNTER — Ambulatory Visit: Payer: Self-pay | Admitting: Nurse Practitioner

## 2024-04-16 DIAGNOSIS — E785 Hyperlipidemia, unspecified: Secondary | ICD-10-CM

## 2024-04-16 MED ORDER — ROSUVASTATIN CALCIUM 20 MG PO TABS: 20.0000 mg | ORAL_TABLET | Freq: Every day | ORAL | 3 refills | Status: DC

## 2024-04-16 MED ORDER — ROSUVASTATIN CALCIUM 10 MG PO TABS: 10.0000 mg | ORAL_TABLET | Freq: Every day | ORAL | Status: AC

## 2024-12-07 ENCOUNTER — Ambulatory Visit: Payer: Medicaid Other | Admitting: Nurse Practitioner

## 2024-12-07 ENCOUNTER — Other Ambulatory Visit (HOSPITAL_COMMUNITY)
Admission: RE | Admit: 2024-12-07 | Discharge: 2024-12-07 | Disposition: A | Source: Ambulatory Visit | Attending: Nurse Practitioner | Admitting: Nurse Practitioner

## 2024-12-07 ENCOUNTER — Encounter: Payer: Self-pay | Admitting: Nurse Practitioner

## 2024-12-07 VITALS — BP 100/62 | HR 60 | Temp 97.7°F | Ht 64.0 in | Wt 167.8 lb

## 2024-12-07 DIAGNOSIS — Z124 Encounter for screening for malignant neoplasm of cervix: Secondary | ICD-10-CM | POA: Insufficient documentation

## 2024-12-07 DIAGNOSIS — Z131 Encounter for screening for diabetes mellitus: Secondary | ICD-10-CM | POA: Diagnosis not present

## 2024-12-07 DIAGNOSIS — Z1231 Encounter for screening mammogram for malignant neoplasm of breast: Secondary | ICD-10-CM

## 2024-12-07 DIAGNOSIS — Z0000 Encounter for general adult medical examination without abnormal findings: Secondary | ICD-10-CM | POA: Diagnosis not present

## 2024-12-07 DIAGNOSIS — Z720 Tobacco use: Secondary | ICD-10-CM

## 2024-12-07 DIAGNOSIS — E7800 Pure hypercholesterolemia, unspecified: Secondary | ICD-10-CM | POA: Insufficient documentation

## 2024-12-07 DIAGNOSIS — E663 Overweight: Secondary | ICD-10-CM | POA: Diagnosis not present

## 2024-12-07 DIAGNOSIS — Z126 Encounter for screening for malignant neoplasm of bladder: Secondary | ICD-10-CM | POA: Diagnosis not present

## 2024-12-07 DIAGNOSIS — Z1211 Encounter for screening for malignant neoplasm of colon: Secondary | ICD-10-CM

## 2024-12-07 DIAGNOSIS — F419 Anxiety disorder, unspecified: Secondary | ICD-10-CM | POA: Diagnosis not present

## 2024-12-07 DIAGNOSIS — Z113 Encounter for screening for infections with a predominantly sexual mode of transmission: Secondary | ICD-10-CM | POA: Diagnosis not present

## 2024-12-07 LAB — CBC
HCT: 40.8 % (ref 36.0–46.0)
Hemoglobin: 13.8 g/dL (ref 12.0–15.0)
MCHC: 33.9 g/dL (ref 30.0–36.0)
MCV: 94.5 fl (ref 78.0–100.0)
Platelets: 347 K/uL (ref 150.0–400.0)
RBC: 4.32 Mil/uL (ref 3.87–5.11)
RDW: 14.2 % (ref 11.5–15.5)
WBC: 8 K/uL (ref 4.0–10.5)

## 2024-12-07 LAB — COMPREHENSIVE METABOLIC PANEL WITH GFR
ALT: 16 U/L (ref 3–35)
AST: 17 U/L (ref 5–37)
Albumin: 4 g/dL (ref 3.5–5.2)
Alkaline Phosphatase: 52 U/L (ref 39–117)
BUN: 11 mg/dL (ref 6–23)
CO2: 30 meq/L (ref 19–32)
Calcium: 9 mg/dL (ref 8.4–10.5)
Chloride: 104 meq/L (ref 96–112)
Creatinine, Ser: 0.62 mg/dL (ref 0.40–1.20)
GFR: 105.38 mL/min
Glucose, Bld: 84 mg/dL (ref 70–99)
Potassium: 4.5 meq/L (ref 3.5–5.1)
Sodium: 138 meq/L (ref 135–145)
Total Bilirubin: 0.9 mg/dL (ref 0.2–1.2)
Total Protein: 6.3 g/dL (ref 6.0–8.3)

## 2024-12-07 LAB — URINALYSIS, MICROSCOPIC ONLY: RBC / HPF: NONE SEEN

## 2024-12-07 LAB — LIPID PANEL
Cholesterol: 245 mg/dL — ABNORMAL HIGH (ref 28–200)
HDL: 68 mg/dL
LDL Cholesterol: 156 mg/dL — ABNORMAL HIGH (ref 10–99)
NonHDL: 177.49
Total CHOL/HDL Ratio: 4
Triglycerides: 109 mg/dL (ref 10.0–149.0)
VLDL: 21.8 mg/dL (ref 0.0–40.0)

## 2024-12-07 LAB — TSH: TSH: 2.23 u[IU]/mL (ref 0.35–5.50)

## 2024-12-07 LAB — HEMOGLOBIN A1C: Hgb A1c MFr Bld: 5 % (ref 4.6–6.5)

## 2024-12-07 NOTE — Assessment & Plan Note (Signed)
 Pending urine microscopy rule out microscopic hematuria

## 2024-12-07 NOTE — Patient Instructions (Signed)
 Nice to see you today I will be in touch with the labs once I have them Follow up with me 1 year, sooner if you need me

## 2024-12-07 NOTE — Assessment & Plan Note (Signed)
 Discussed age-appropriate immunizations and screening exams.  Did review patient's personal, surgical, social, family histories.  Patient is up-to-date with all age-appropriate vaccinations she would like.  Patient declined flu and pneumonia vaccine today.  Cologuard ordered for CRC screening.  Pap smear performed today along with STI screening routinely.  Patient is too young for bone density scan.  Patient was given information at discharge about preventative healthcare maintenance with anticipatory guidance.

## 2024-12-07 NOTE — Assessment & Plan Note (Signed)
 History of the same.  Patient was prescribed rosuvastatin  10 mg daily patient currently not taking medication.  Pending lipid panel today

## 2024-12-07 NOTE — Assessment & Plan Note (Signed)
 History of the same.  Patient currently followed by Dr. Emilio Aurora.  Maintained on Klonopin 1 to 2 mg twice daily as needed.

## 2024-12-07 NOTE — Assessment & Plan Note (Signed)
 History of the same pending A1c, lipid panel, TSH today.

## 2024-12-07 NOTE — Progress Notes (Signed)
 "  Established Patient Office Visit  Subjective   Patient ID: Nichole Allen, female    DOB: [birth year]  Age: 49 y.o. MRN: [id number]  Chief Complaint  Patient presents with   Annual Exam    HPI  Anxiety: Currently maintained on Klonopin 1 to 2 mg twice daily as needed anxiety patient is followed by Dr. Vincente psychiatry  ADHD: Maintained on Adderall 30 mg twice daily  HLD: Currently maintained on rosuvastatin  10 mg daily. Does not t currently take her rosuvastatin   for complete physical and follow up of chronic conditions.  Immunizations: -Tetanus: Completed in 2024 -Influenza:refused  -Shingles: Too young -Pneumonia: Declines  Diet: Fair diet. She is eating 2 meals. She will snack sometimes. She will dirk water and sometimes soda  Exercise: No regular exercise. Sheis getting back into the [place]. Will try to go 3 times a week   Eye exam: Completes annually. Over the counter readers Dental exam: mango  Colonoscopy: Completed in Order 01/28/2024 not completed? Lung Cancer Screening: Current smoker, too young for LDCT does not qualify  Pap smear:  update today, with STI testing panel  Mammogram: 02/12/2024, repeat 1 year  DEXA: Too young  Sleep: states that she will get around 5-9 hours. States that it depends on the kids scheduleds. States that she does feel rested       Review of Systems  Constitutional:  Negative for chills and fever.  Respiratory:  Negative for shortness of breath.   Cardiovascular:  Negative for chest pain and leg swelling.  Gastrointestinal:  Negative for abdominal pain, blood in stool, constipation, diarrhea, nausea and vomiting.       BM daily   Genitourinary:  Negative for dysuria and hematuria.  Neurological:  Negative for dizziness, tingling and headaches.  Psychiatric/Behavioral:  Negative for hallucinations and suicidal ideas.       Objective:     BP 100/62   Pulse 60   Temp 97.7 F (36.5 C) (Oral)   Ht 5' 4 (1.626  m)   Wt 167 lb 12.8 oz (76.1 kg)   LMP 11/30/2024 (Exact Date)   SpO2 100%   BMI 28.80 kg/m  BP Readings from Last 3 Encounters:  12/07/24 100/62  02/11/24 112/82  12/05/23 114/82   Wt Readings from Last 3 Encounters:  12/07/24 167 lb 12.8 oz (76.1 kg)  02/11/24 163 lb 12.8 oz (74.3 kg)  01/28/24 160 lb (72.6 kg)   SpO2 Readings from Last 3 Encounters:  12/07/24 100%  02/11/24 99%  12/05/23 98%      Physical Exam Vitals and nursing note reviewed. Exam conducted with a chaperone present Devere Hummer, CMA).  Constitutional:      Appearance: Normal appearance.  HENT:     Right Ear: Tympanic membrane, ear canal and external ear normal.     Left Ear: Tympanic membrane, ear canal and external ear normal.     Mouth/Throat:     Mouth: Mucous membranes are moist.     Pharynx: Oropharynx is clear.  Eyes:     Extraocular Movements: Extraocular movements intact.     Pupils: Pupils are equal, round, and reactive to light.  Cardiovascular:     Rate and Rhythm: Normal rate and regular rhythm.     Pulses: Normal pulses.     Heart sounds: Normal heart sounds.  Pulmonary:     Effort: Pulmonary effort is normal.     Breath sounds: Normal breath sounds.  Abdominal:     General:  Bowel sounds are normal. There is no distension.     Palpations: There is no mass.     Tenderness: There is no abdominal tenderness.     Hernia: No hernia is present.  Genitourinary:    Exam position: Lithotomy position.     Vagina: Normal.     Cervix: Discharge present.     Uterus: Normal.      Adnexa: Right adnexa normal and left adnexa normal.  Musculoskeletal:     Right lower leg: No edema.     Left lower leg: No edema.  Lymphadenopathy:     Cervical: No cervical adenopathy.  Skin:    General: Skin is warm.  Neurological:     General: No focal deficit present.     Mental Status: She is alert.     Deep Tendon Reflexes:     Reflex Scores:      Bicep reflexes are 2+ on the right side and 2+ on  the left side.      Patellar reflexes are 2+ on the right side and 2+ on the left side.    Comments: Bilateral upper and lower extremity strength 5/5  Psychiatric:        Mood and Affect: Mood normal.        Behavior: Behavior normal.        Thought Content: Thought content normal.        Judgment: Judgment normal.      No results found for any visits on 12/07/24.    The 10-year ASCVD risk score (Arnett DK, et al., 2019) is: 0.7%    Assessment & Plan:   Problem List Items Addressed This Visit       Other   Preventative health care - Primary   Discussed age-appropriate immunizations and screening exams.  Did review patient's personal, surgical, social, family histories.  Patient is up-to-date with all age-appropriate vaccinations she would like.  Patient declined flu and pneumonia vaccine today.  Cologuard ordered for CRC screening.  Pap smear performed today along with STI screening routinely.  Patient is too young for bone density scan.  Patient was given information at discharge about preventative healthcare maintenance with anticipatory guidance.      Relevant Orders   CBC   Comprehensive metabolic panel with GFR   Anxiety   History of the same.  Patient currently followed by Dr. Emilio Aurora.  Maintained on Klonopin 1 to 2 mg twice daily as needed.      Relevant Orders   TSH   Overweight   History of the same pending A1c, lipid panel, TSH today.      Tobacco abuse   Pending urine microscopy rule out microscopic hematuria      Relevant Orders   Urine Microscopic   Pure hypercholesterolemia   History of the same.  Patient was prescribed rosuvastatin  10 mg daily patient currently not taking medication.  Pending lipid panel today      Relevant Orders   Lipid panel   Other Visit Diagnoses       Screening for cervical cancer       Relevant Orders   Cytology - PAP     Encounter for screening mammogram for malignant neoplasm of breast         Screening for  bladder cancer         Screening examination for STI         Screening for diabetes mellitus       Relevant Orders  Hemoglobin A1c     Screening for colon cancer       Relevant Orders   Cologuard       Return in about 1 year (around 12/07/2025) for CPE and Labs.    Adina Crandall, NP  "

## 2024-12-08 LAB — CYTOLOGY - PAP
Chlamydia: NEGATIVE
Comment: NEGATIVE
Comment: NEGATIVE
Comment: NEGATIVE
Comment: NORMAL
Diagnosis: NEGATIVE
High risk HPV: NEGATIVE
Neisseria Gonorrhea: NEGATIVE
Trichomonas: NEGATIVE

## 2024-12-10 ENCOUNTER — Ambulatory Visit: Payer: Self-pay | Admitting: Nurse Practitioner

## 2025-12-08 ENCOUNTER — Encounter: Admitting: Nurse Practitioner
